# Patient Record
Sex: Female | Born: 1950 | Race: White | Hispanic: No | Marital: Single | Smoking: Former smoker
Health system: Southern US, Community
[De-identification: ages and names within clinical notes are randomized; demographics above are authoritative.]

## PROBLEM LIST (undated history)

## (undated) DIAGNOSIS — D369 Benign neoplasm, unspecified site: Secondary | ICD-10-CM

## (undated) DIAGNOSIS — F329 Major depressive disorder, single episode, unspecified: Secondary | ICD-10-CM

## (undated) DIAGNOSIS — B029 Zoster without complications: Secondary | ICD-10-CM

## (undated) DIAGNOSIS — F32A Depression, unspecified: Secondary | ICD-10-CM

## (undated) DIAGNOSIS — J449 Chronic obstructive pulmonary disease, unspecified: Secondary | ICD-10-CM

## (undated) DIAGNOSIS — E785 Hyperlipidemia, unspecified: Secondary | ICD-10-CM

## (undated) DIAGNOSIS — F419 Anxiety disorder, unspecified: Secondary | ICD-10-CM

## (undated) DIAGNOSIS — I10 Essential (primary) hypertension: Secondary | ICD-10-CM

## (undated) DIAGNOSIS — R002 Palpitations: Secondary | ICD-10-CM

## (undated) DIAGNOSIS — Z720 Tobacco use: Secondary | ICD-10-CM

## (undated) DIAGNOSIS — K635 Polyp of colon: Secondary | ICD-10-CM

## (undated) DIAGNOSIS — I2510 Atherosclerotic heart disease of native coronary artery without angina pectoris: Secondary | ICD-10-CM

## (undated) DIAGNOSIS — M1990 Unspecified osteoarthritis, unspecified site: Secondary | ICD-10-CM

## (undated) DIAGNOSIS — M810 Age-related osteoporosis without current pathological fracture: Secondary | ICD-10-CM

## (undated) DIAGNOSIS — E781 Pure hyperglyceridemia: Secondary | ICD-10-CM

## (undated) HISTORY — PX: BREAST BIOPSY: SHX20

## (undated) HISTORY — DX: Depression, unspecified: F32.A

## (undated) HISTORY — DX: Chronic obstructive pulmonary disease, unspecified: J44.9

## (undated) HISTORY — DX: Hyperlipidemia, unspecified: E78.5

## (undated) HISTORY — DX: Zoster without complications: B02.9

## (undated) HISTORY — DX: Essential (primary) hypertension: I10

## (undated) HISTORY — DX: Tobacco use: Z72.0

## (undated) HISTORY — PX: MASTECTOMY, PARTIAL: SHX709

## (undated) HISTORY — DX: Unspecified osteoarthritis, unspecified site: M19.90

## (undated) HISTORY — DX: Age-related osteoporosis without current pathological fracture: M81.0

## (undated) HISTORY — DX: Pure hyperglyceridemia: E78.1

## (undated) HISTORY — PX: VAGINAL HYSTERECTOMY: SUR661

## (undated) HISTORY — DX: Atherosclerotic heart disease of native coronary artery without angina pectoris: I25.10

## (undated) HISTORY — DX: Polyp of colon: K63.5

## (undated) HISTORY — PX: CORONARY STENT PLACEMENT: SHX1402

## (undated) HISTORY — DX: Anxiety disorder, unspecified: F41.9

## (undated) HISTORY — DX: Benign neoplasm, unspecified site: D36.9

## (undated) HISTORY — PX: TONSILLECTOMY: SUR1361

## (undated) HISTORY — DX: Major depressive disorder, single episode, unspecified: F32.9

## (undated) HISTORY — DX: Palpitations: R00.2

---

## 1998-03-01 ENCOUNTER — Other Ambulatory Visit: Admission: RE | Admit: 1998-03-01 | Discharge: 1998-03-01 | Payer: Self-pay | Admitting: *Deleted

## 1999-07-21 DIAGNOSIS — D369 Benign neoplasm, unspecified site: Secondary | ICD-10-CM | POA: Insufficient documentation

## 1999-07-21 HISTORY — DX: Benign neoplasm, unspecified site: D36.9

## 2000-07-05 ENCOUNTER — Encounter (INDEPENDENT_AMBULATORY_CARE_PROVIDER_SITE_OTHER): Payer: Self-pay

## 2000-07-05 ENCOUNTER — Ambulatory Visit (HOSPITAL_COMMUNITY): Admission: RE | Admit: 2000-07-05 | Discharge: 2000-07-05 | Payer: Self-pay | Admitting: Gastroenterology

## 2001-06-18 ENCOUNTER — Emergency Department (HOSPITAL_COMMUNITY): Admission: EM | Admit: 2001-06-18 | Discharge: 2001-06-18 | Payer: Self-pay | Admitting: Emergency Medicine

## 2002-03-03 ENCOUNTER — Encounter: Payer: Self-pay | Admitting: Cardiology

## 2002-03-03 ENCOUNTER — Ambulatory Visit (HOSPITAL_COMMUNITY): Admission: RE | Admit: 2002-03-03 | Discharge: 2002-03-04 | Payer: Self-pay | Admitting: Cardiology

## 2002-11-30 ENCOUNTER — Ambulatory Visit (HOSPITAL_COMMUNITY): Admission: RE | Admit: 2002-11-30 | Discharge: 2002-11-30 | Payer: Self-pay | Admitting: Internal Medicine

## 2003-11-20 ENCOUNTER — Observation Stay (HOSPITAL_COMMUNITY): Admission: EM | Admit: 2003-11-20 | Discharge: 2003-11-21 | Payer: Self-pay | Admitting: Emergency Medicine

## 2005-08-11 ENCOUNTER — Encounter (INDEPENDENT_AMBULATORY_CARE_PROVIDER_SITE_OTHER): Payer: Self-pay | Admitting: *Deleted

## 2005-08-11 ENCOUNTER — Ambulatory Visit (HOSPITAL_COMMUNITY): Admission: RE | Admit: 2005-08-11 | Discharge: 2005-08-11 | Payer: Self-pay | Admitting: Gastroenterology

## 2007-09-18 DIAGNOSIS — B029 Zoster without complications: Secondary | ICD-10-CM

## 2007-09-18 HISTORY — DX: Zoster without complications: B02.9

## 2009-08-14 ENCOUNTER — Encounter: Admission: RE | Admit: 2009-08-14 | Discharge: 2009-08-14 | Payer: Self-pay | Admitting: Gastroenterology

## 2010-06-02 ENCOUNTER — Encounter: Admission: RE | Admit: 2010-06-02 | Discharge: 2010-06-02 | Payer: Self-pay | Admitting: Family Medicine

## 2010-06-27 ENCOUNTER — Encounter
Admission: RE | Admit: 2010-06-27 | Discharge: 2010-06-27 | Payer: Self-pay | Source: Home / Self Care | Attending: Family Medicine | Admitting: Family Medicine

## 2010-08-20 ENCOUNTER — Encounter: Payer: Self-pay | Admitting: Neurosurgery

## 2010-12-05 NOTE — Op Note (Signed)
NAME:  Victoria Gibson, Victoria Gibson              ACCOUNT NO.:  [id number]   MEDICAL RECORD NO.:  [id number]          PATIENT TYPE:  AMB   LOCATION:  ENDO                         FACILITY:  [facility]   PHYSICIAN:  Danise Edge, M.D.   DATE OF BIRTH:  09/14/1950   DATE OF PROCEDURE:  08/11/2005  DATE OF DISCHARGE:                                 OPERATIVE REPORT   PROCEDURE:  Colonoscopy and polypectomy.   REFERRING PHYSICIAN:  Dr. Madison Hickman.   INDICATIONS:  Victoria Gibson is a 60 year old female born May 28, 1951. Victoria Gibson underwent her first screening colonoscopy approximately 5  years ago and a small tubular adenomatous polyp was removed from her rectum.  Her mother and brother have undergone colonoscopic exams to remove colon  polyps.   ENDOSCOPIST:  Danise Edge, M.D.   PREMEDICATION:  Versed 7.5 mg, Demerol 50 mg.   DESCRIPTION OF PROCEDURE:  After obtaining informed consent, Victoria Gibson was  placed in the left lateral decubitus position. I administered intravenous  Demerol and intravenous Versed to achieve conscious sedation for the  procedure. The patient's blood pressure, oxygen saturation and cardiac  rhythm were monitored throughout the procedure and documented in the medical  record.   Anal inspection and digital rectal exam were normal. The Olympus adjustable  pediatric colonoscope was introduced into the rectum and easily advanced to  the cecum. A normal-appearing ileocecal valve and appendiceal orifice were  identified. Colonic preparation for the exam today was excellent.   RECTUM:  Normal. Retroflexed view of the distal rectum normal.  SIGMOID COLON AND DESCENDING COLON:  At 30 cm from the anal verge, a 1-mm  sessile polyp was removed with the cold biopsy forceps. At 40 cm from the  anal verge, a 2-mm sessile polyp was removed with the electrocautery snare.  SPLENIC FLEXURE:  Normal.  TRANSVERSE COLON:  Normal.  HEPATIC FLEXURE:  Normal.  ASCENDING COLON:   Normal.  CECUM AND ILEOCECAL VALVE:  Normal.   ASSESSMENT:  Two small polyps were removed from the sigmoid colon; otherwise  normal surveillance proctocolonoscopy to cecum.   RECOMMENDATIONS:  Repeat colonoscopy in 5 years.           ______________________________  Danise Edge, M.D.     MJ/MEDQ  D:  08/11/2005  T:  08/11/2005  Job:  [id number]   cc:   Darius Bump, M.D.  Fax: [phone]

## 2010-12-05 NOTE — Cardiovascular Report (Signed)
NAME:  Victoria Gibson, Victoria Gibson                          ACCOUNT NO.:  [id number]   MEDICAL RECORD NO.:  [id number]                   PATIENT TYPE:  OIB   LOCATION:  6533                                 FACILITY:  [facility]   PHYSICIAN:  Cristy Hilts. Jacinto Halim, M.D.                  DATE OF BIRTH:  04-05-1951   DATE OF PROCEDURE:  DATE OF DISCHARGE:  03/04/2002                              CARDIAC CATHETERIZATION   PROCEDURE PERFORMED:  Percutaneous transluminal coronary angioplasty and  stenting of the mid right coronary artery.   INDICATION:  The patient is a 60 year old female with a history of  hypertension, hyperlipidemia, smoking presents with chest pain suggestive of  angina pectoris of class II to III.  She underwent diagnostic cardiac  catheterization at [facility] __________ and she is scheduled  for elective intervention of the right coronary artery stenosis.   INTERVENTIONAL DATA:  1. Successful primary stenting of the mid right coronary artery with a 3.5 x     13 mm Zeta stent deployed at 16 atmospheres of pressure gaining a 3.81 mm     lumen.  The stenosis was reduced from 70% to 0%. The TIMI flow was from     TIMI-3 to TIMI-3 flow. There was no evidence of dissection, no evidence     of thrombus at the end of the procedure.  2. Adjuvant Angiomax used. Intracoronary nitroglycerin administration.   RECOMMENDATIONS:  Continued aggressive risk factor modification including  smoking cessation is indicated. The patient has significantly reduced her  smoking habits.   TECHNIQUE OF CARDIAC INTERVENTION:  Under the usual sterile precautions,  using a 7 French right femoral artery access and a 7 [place] FR4 guide with  manually made side holes was advanced into the ascending aorta over the  0.035 inch J wire.  The right coronary artery was selectively engaged and  angiography was performed. Initially, there were no holes in the guide,  however, the holes were made with the help of a  percutaneous needle.  Then a  180 cm x 0.014 inch ATW guide wire was advanced into the right coronary  artery and the tip of the wire was carefully positioned in the distal right  coronary artery. Angiography was performed.  Then a 3.5 x 13 mm Zeta stent  was advanced in the right coronary artery and after confirming the position  of the stent, the stent was deployed at 16 atmospheres of pressure.  Intracoronary nitroglycerin was also administration.  There was initially  spasm noted in the outflow of the stent.  However, repeat injections  revealed excellent flow with no evidence of dissection, no evidence of  thrombus. Hence, the guide wire was removed out of the right coronary  artery.  The guide was disengaged and pulled out of the body over a 0.035  inch J wire. The arterial access was sutured in place and the patient  was  transferred to recovery in a stable condition. During the procedure,  Angiomax was used for anticoagulation and ACT was maintained at therapeutic  range. The patient tolerated the procedure well.                                               Cristy Hilts. Jacinto Halim, M.D.    Pilar Plate  D:  03/03/2002  T:  03/07/2002  Job:  [id number]   cc:   Darius Bump, M.D.

## 2010-12-05 NOTE — Discharge Summary (Signed)
NAME:  Victoria Gibson, Victoria Gibson                          ACCOUNT NO.:  [id number]   MEDICAL RECORD NO.:  [id number]                   PATIENT TYPE:  INP   LOCATION:  4731                                 FACILITY:  [facility]   PHYSICIAN:  Cristy Hilts. Jacinto Halim, M.D.                  DATE OF BIRTH:  Apr 01, 1951   DATE OF ADMISSION:  11/20/2003  DATE OF DISCHARGE:  11/21/2003                                 DISCHARGE SUMMARY   DISCHARGE DIAGNOSES:  1. Chest pain consistent with unstable angina, no restenosis and previously     stented right coronary artery site.  2. Bronchitis.  3. Smoking.  4. Treated hypertension.  5. Treated hyperlipidemia.   HOSPITAL COURSE:  The patient is a 60 year old female with a history of  coronary disease.  She had an RCA stent in August 2003.  She presented to  the emergency room, Nov 20, 2003, with complaints of chest pain.  She had  also complained of 2 weeks of increasing dyspnea on exertion.  She  complained of midsternal pressure which went down her left arm and up into  her left neck and jaw.  This was similar to her pre-intervention symptoms.  She was admitted to telemetry, started on IV heparin and nitrates and set up  for diagnostic catheterization; this was done Nov 20, 2003 by Dr. Madaline Savage.  This revealed normal left system and normal LV function and a 40%  in-stent restenosis in the previously placed RCA stent.  Plan is for  continued medical therapy.  She was seen in consult by the smoking cessation  group.  We feel she can be discharged Nov 21, 2003.  We did note that her  white count was slightly elevated at 11.9.  Her chest x-ray showed chronic  bronchitic changes.  She complains of a cough as well as shortness of  breath.  On exam, she has expiratory wheezing and rhonchi.  She is being  treated for bronchitis and will be put on a Z-Pack.  She was also given a  Ventolin treatment prior to discharge.  She will follow up with Dr. Cristy Hilts.  Jacinto Halim, Dec 14, 2003.   DISCHARGE MEDICATIONS:  1. Zithromax 250 mg once a day for 4 days.  2. Toprol-XL 50 mg a day.  3. Accupril 20 mg a day.  4. Zetia 10 mg a day.  5. Crestor 20 mg a day.  6. Plavix 75 mg a day.  7. Aspirin 81 mg a day.  8. Nitroglycerin sublingual p.r.n.   LABORATORIES:  White count 11.9, hemoglobin 13.5, hematocrit 38.4, platelets  302,000.  Sodium 141, potassium 3.7, BUN 5, creatinine 0.9, glucose 85.  CK-  MB and troponins are negative x3.  Lipid profile shows a cholesterol of 198,  triglycerides 377, HDL 33, LDL 90.   PA and lateral chest x-ray showed chronic changes with no active disease,  there was no evidence for a right lower lobe mass or infiltrate as seen on  previous portable chest x-ray.   DISPOSITION:  The patient is discharged in stable condition and will follow  up with Dr. Jacinto Halim, Dec 14, 2003 at 10:45.  She will follow up with her  family doctor, Dr. Darius Bump.      Abelino Derrick, P.A.                      Cristy Hilts. Jacinto Halim, M.D.    Lenard Lance  D:  11/21/2003  T:  11/22/2003  Job:  [id number]   cc:   Darius Bump, M.D.  [address]  [place] [address]  Fax: [phone]   Cristy Hilts. Jacinto Halim, M.D.  [address]  [place]  [place] [address]  Fax: [phone]

## 2010-12-05 NOTE — Cardiovascular Report (Signed)
NAMEALBANY, WINSLOW NO.:  [id number]   MEDICAL RECORD NO.:  [id number]                   PATIENT TYPE:  INP   LOCATION:  4731                                 FACILITY:  [facility]   PHYSICIAN:  Madaline Savage, M.D.             DATE OF BIRTH:  [birth year]   DATE OF PROCEDURE:  11/20/2003  DATE OF DISCHARGE:  11/21/2003                              CARDIAC CATHETERIZATION   PATIENT PROFILE:  The patient is a 60 year old white female patient of Dr.  Verl Dicker who previously underwent stenting to her mid right coronary artery  on or around March 03, 2002.  The patient presented to the emergency room  today with chest pain that had somewhat anginal and somewhat atypical  features with negative cardiac enzymes and no evidence of ischemia on her  EKG.  She was admitted.  She was brought to the cath lab electively.  The  patient has previously been taking aspirin and Plavix and diagnostic cardiac  catheterization was completed without significant problems.   RESULTS:   PRESSURES:  Left ventricular pressure was 138/9, end-diastolic pressure 16.  Central aortic pressure 140/82 with a mean of 110.  No aortic valve gradient  by pullback technique.   ANGIOGRAPHIC RESULTS:  1. The left main coronary artery was normal.  2. The LAD coronary artery gave rise to a diagonal branch just after the     first septal perforator branch above the LAD, itself and the diagonal     were normal.  3. The circumflex terminated on the anterolateral wall where it bifurcated     and no lesions were seen.  4. The right coronary artery was large and dominant.  There was a     radioopaque stent in the mid portion of the vessel just beyond an acute     marginal branch.  There was at most 40% in-stent restenosis in an LAO     view with excellent distal run off into what appeared to be about a 3.5     mm vessel throughout most of the course of this vessel.  The in-stent     restenosis  appeared smooth and there was TIMI-3 flow throughout with no     peculiar dye filling or emptying of the 40% area of ISR.   The left ventricular angiogram showed normal LV contractility of all wall  segments.  Ejection fraction 65%.  No mitral regurgitation seen.   FINAL IMPRESSION:  1. Single vessel coronary artery disease, status post right coronary artery     stenting August 2003.  2. 40% in-stent restenosis not felt to be hemodynamically significant.  3. Chronic tobacco use with atypical chest pain.   PLAN:  Smoking cessation will be stressed hard.  The patient will be  maintained on aspirin and Plavix and followed up by Dr. Jacinto Halim.  Cardiac  stress test is recommended periodically.  Madaline Savage, M.D.    WHG/MEDQ  D:  11/20/2003  T:  11/21/2003  Job:  [id number]

## 2010-12-05 NOTE — Op Note (Signed)
[facility]  Patient:    Victoria Gibson, Victoria Gibson                 MRN: [id number]. Date: 07/05/00 Attending:  Verlin Grills, M.D.                           Operative Report  PROCEDURES:  Colonoscopy.  INDICATION:  Ms. Victoria Gibson (date of birth Feb 10, 1951) is a 60 year old female who is referred for surveillance colonoscopy and polypectomy to prevent colon cancer.  Ms. Victoria Gibson mother has undergone routine surveillance colonoscopies and has had malignant polyps removed.  Ms. Victoria Gibson brother has undergone colonoscopic exams, and nonmalignant polyps have been removed.  Ms. Victoria Gibson viewed our colonoscopy education film in my office.  I discussed with her the complications associated with colonoscopy and polypectomy including a 1 per 1000 risk of bleeding and 4 per 1000 risk of intestinal perforation requiring surgical repair.  Ms. Victoria Gibson has signed the operative permit.  ENDOSCOPIST:  Thornton Park. Daphine Deutscher, M.D.  MEDICATION ALLERGIES:   None.  CHRONIC MEDICATIONS:  Calcium and multivitamin.  PAST MEDICAL HISTORY:  Cigarette smoking, hyperlipidemia, anxiety disorder, benign breast disease, postmenopausal, hysterectomy for uterine fibroids.  PREMEDICATION:  Versed 8.5 mg, Demerol 50 mg.  ENDOSCOPE:  Olympus pediatric colonoscope.  DESCRIPTION OF PROCEDURE:  After obtaining informed consent, the patient was placed in the left lateral decubitus position.  I administered intravenous Demerol and intravenous Versed to achieve conscious sedation for the procedure.  The patients blood pressure, oxygen saturation, and cardiac rhythm were monitored throughout the procedure and documented in the medical records.  Anal inspection was normal.  Digital rectal exam was normal.  The Olympus pediatric video colonoscope was introduced into the rectum and, under direct vision, advanced to the cecum as identified by a normal-appearing  ileocecal valve.  Colonic preparation for the exam today was excellent.  Rectum and Sigmoid colon:  From the rectum and distal sigmoid colon, three 1 mm sessile polyps were removed with hot biopsy forceps and submitted in one bottle for pathologic evaluation.  Descending Colon: Normal.  Splenic Flexure: Normal.  Transverse Colon: Normal.  Hepatic Flexure: Normal.  Ascending Colon: Normal.  Cecum and Ileocecal Valve: Normal.  ASSESSMENT:  From the rectal and distal sigmoid colon, three 1 mm sessile polyps were removed with hot biopsy forceps and submitted for pathologic interpretation.  RECOMMENDATIONS:  Repeat colonoscopy in approximately five years. DD:  07/05/00 TD:  07/05/00 Job: [id number]

## 2011-07-07 ENCOUNTER — Other Ambulatory Visit: Payer: Self-pay | Admitting: Family Medicine

## 2011-07-07 ENCOUNTER — Ambulatory Visit
Admission: RE | Admit: 2011-07-07 | Discharge: 2011-07-07 | Disposition: A | Discharge status: Home / Self Care | Payer: BC Managed Care – PPO | Source: Ambulatory Visit | Attending: Family Medicine | Admitting: Family Medicine

## 2011-07-07 DIAGNOSIS — J441 Chronic obstructive pulmonary disease with (acute) exacerbation: Secondary | ICD-10-CM

## 2011-08-06 ENCOUNTER — Encounter: Payer: Self-pay | Admitting: *Deleted

## 2011-08-06 ENCOUNTER — Institutional Professional Consult (permissible substitution): Payer: BC Managed Care – PPO | Admitting: Pulmonary Disease

## 2011-11-30 ENCOUNTER — Ambulatory Visit
Admission: RE | Admit: 2011-11-30 | Discharge: 2011-11-30 | Disposition: A | Discharge status: Home / Self Care | Payer: BC Managed Care – PPO | Source: Ambulatory Visit | Attending: Family Medicine | Admitting: Family Medicine

## 2011-11-30 ENCOUNTER — Other Ambulatory Visit: Payer: Self-pay | Admitting: Family Medicine

## 2011-11-30 DIAGNOSIS — R634 Abnormal weight loss: Secondary | ICD-10-CM

## 2011-12-07 IMAGING — CR DG LUMBAR SPINE COMPLETE 4+V
5 series · 5 of 5 positions shown · non-contrast
Comparison: None

CLINICAL DATA: Low back and left hip pain

LUMBAR SPINE - COMPLETE 4+ VIEW

[t l-spine a.p.]
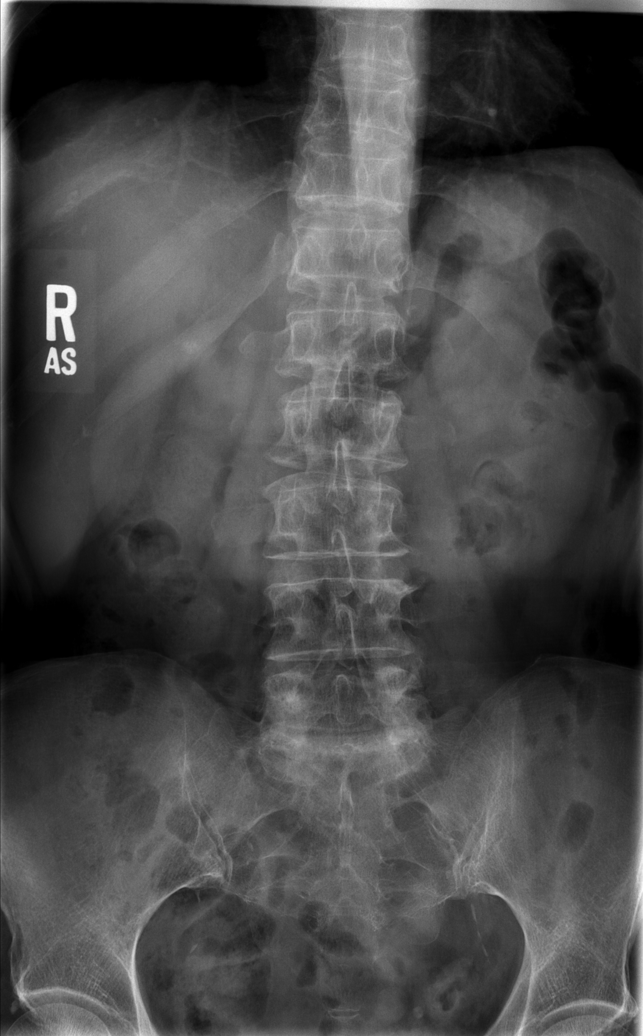

[t l-spine oblique exposure (1 of 2)]
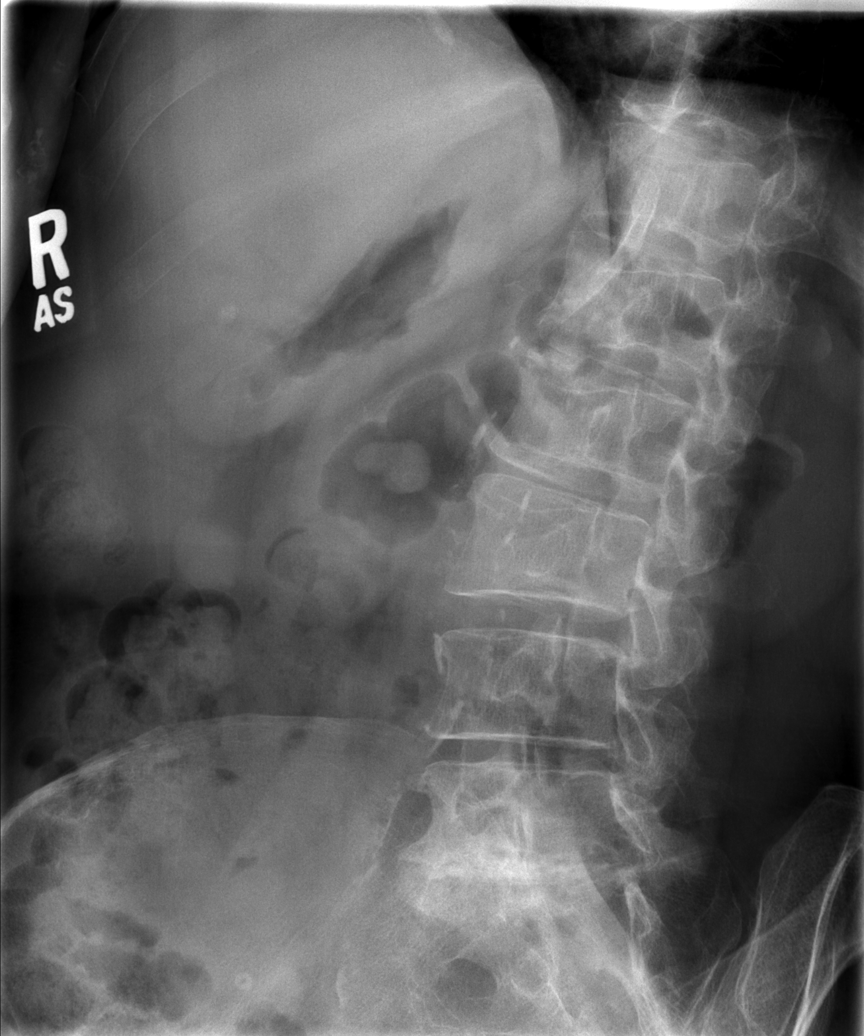

[t l-spine oblique exposure (2 of 2)]
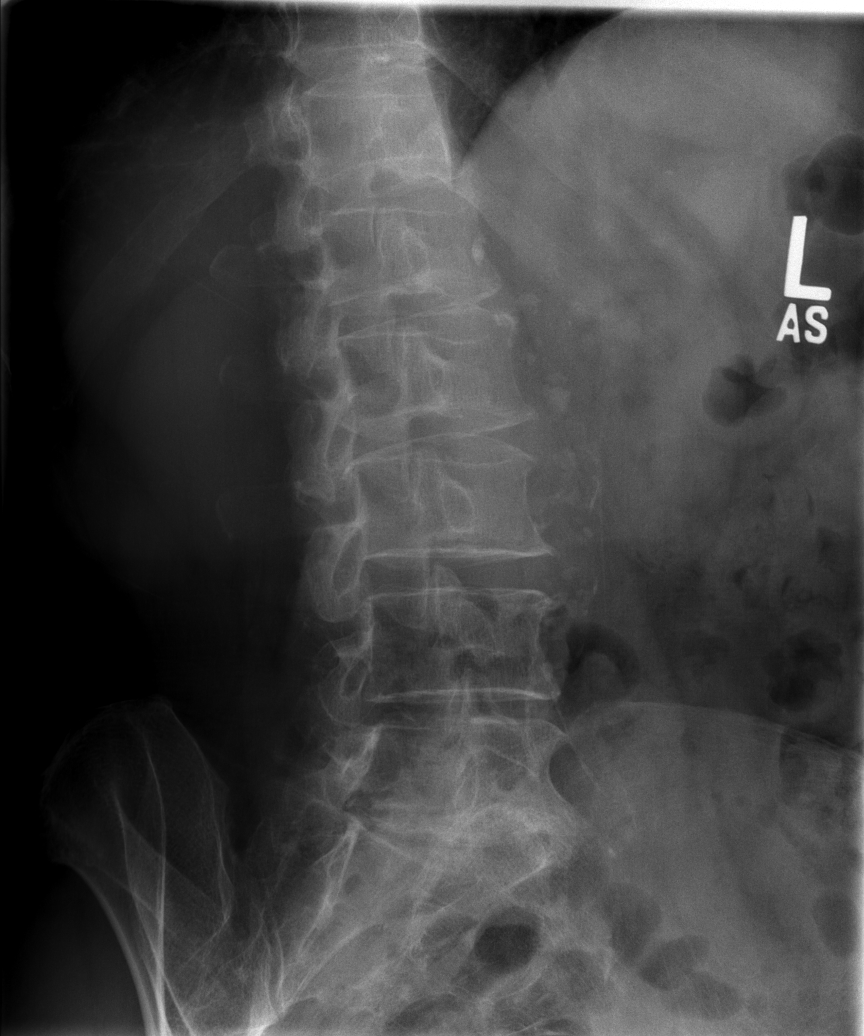

[t l-spine lat]
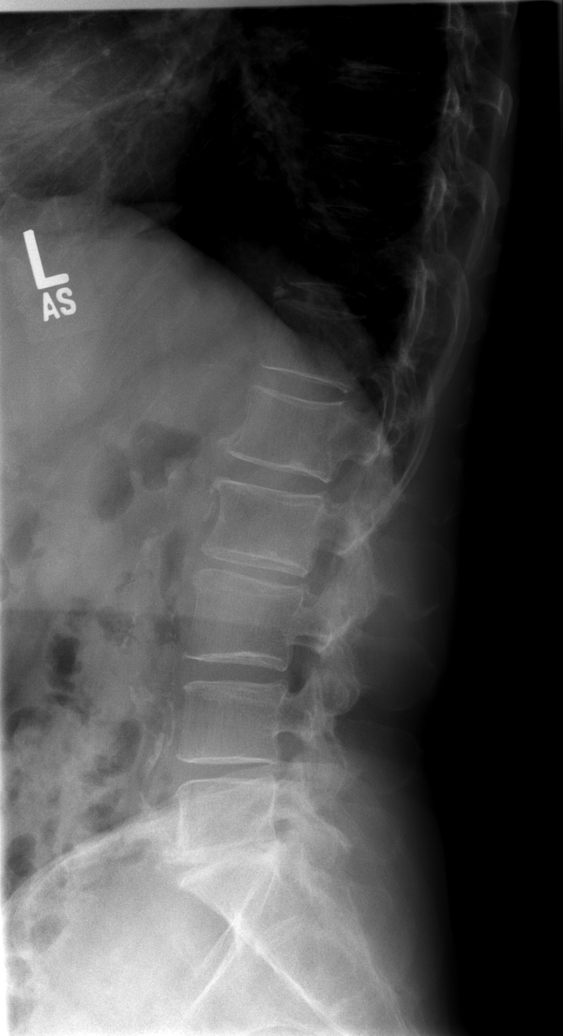

[t l-spine l5-s1 spot]
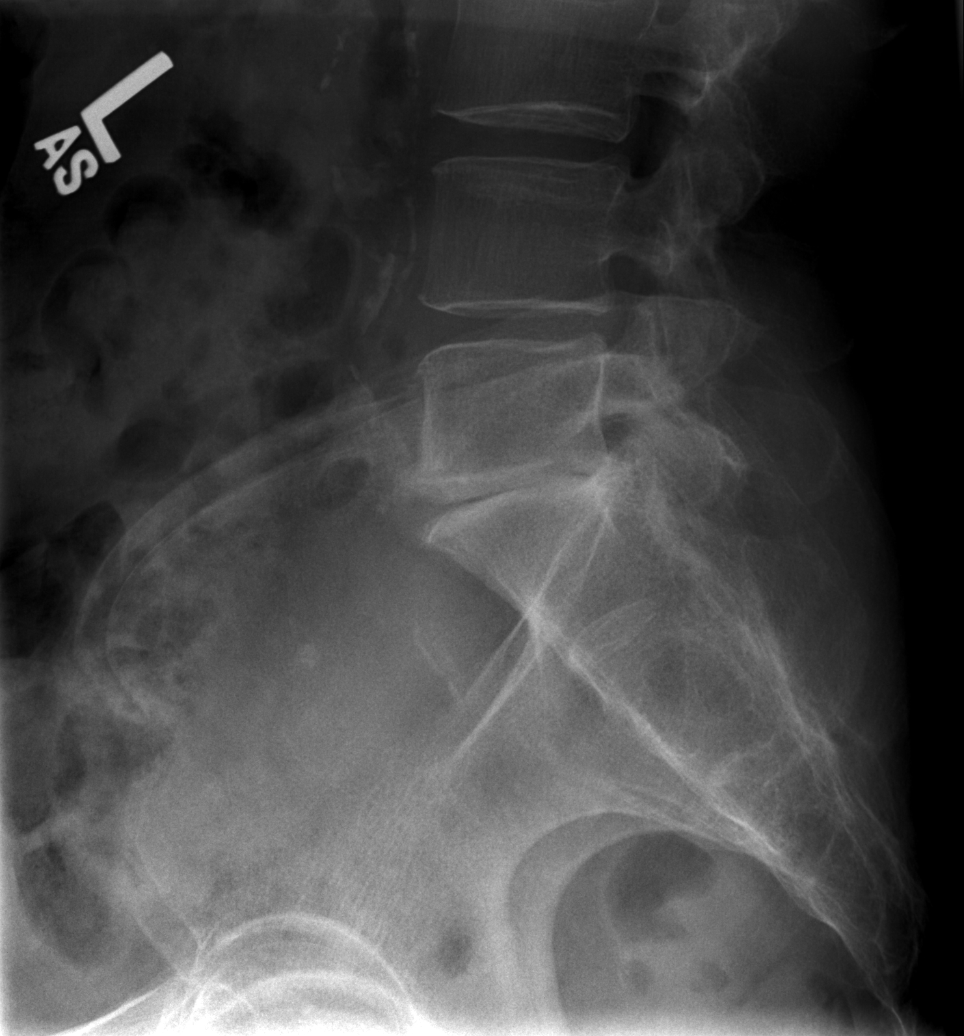

[5 of 5 positions shown; findings below may reference images not displayed]

FINDINGS: There is mild curvature convex to the right.  There is
mild disc space narrowing throughout the lumbar region with the
exception of more advanced disc space narrowing at L5-S1.  No
antero or retrolisthesis.  No significant facet degeneration
detected.  No evidence of fracture or other focal lesion.  There is
a calcification in the right upper quadrant that could be a
gallstone.  There is some aortic calcification.
IMPRESSION: Minimal curvature.  Ordinary lower lumbar degenerative disc
disease.  No identifiably acute abnormality.

## 2011-12-17 ENCOUNTER — Other Ambulatory Visit: Payer: Self-pay | Admitting: Family Medicine

## 2011-12-17 DIAGNOSIS — Z1231 Encounter for screening mammogram for malignant neoplasm of breast: Secondary | ICD-10-CM

## 2011-12-17 DIAGNOSIS — M810 Age-related osteoporosis without current pathological fracture: Secondary | ICD-10-CM

## 2012-02-23 ENCOUNTER — Ambulatory Visit
Admission: RE | Admit: 2012-02-23 | Discharge: 2012-02-23 | Disposition: A | Discharge status: Home / Self Care | Payer: BC Managed Care – PPO | Source: Ambulatory Visit | Attending: Family Medicine | Admitting: Family Medicine

## 2012-02-23 DIAGNOSIS — M810 Age-related osteoporosis without current pathological fracture: Secondary | ICD-10-CM

## 2012-02-23 DIAGNOSIS — Z1231 Encounter for screening mammogram for malignant neoplasm of breast: Secondary | ICD-10-CM

## 2013-09-01 ENCOUNTER — Other Ambulatory Visit: Payer: Self-pay | Admitting: Family Medicine

## 2013-09-01 ENCOUNTER — Ambulatory Visit
Admission: RE | Admit: 2013-09-01 | Discharge: 2013-09-01 | Disposition: A | Discharge status: Home / Self Care | Payer: No Typology Code available for payment source | Source: Ambulatory Visit | Attending: Family Medicine | Admitting: Family Medicine

## 2013-09-01 DIAGNOSIS — M810 Age-related osteoporosis without current pathological fracture: Secondary | ICD-10-CM

## 2013-09-01 DIAGNOSIS — M549 Dorsalgia, unspecified: Secondary | ICD-10-CM

## 2013-09-01 DIAGNOSIS — J449 Chronic obstructive pulmonary disease, unspecified: Secondary | ICD-10-CM

## 2014-04-18 ENCOUNTER — Other Ambulatory Visit: Payer: Self-pay | Admitting: Family

## 2014-04-18 ENCOUNTER — Ambulatory Visit
Admission: RE | Admit: 2014-04-18 | Discharge: 2014-04-18 | Disposition: A | Discharge status: Home / Self Care | Payer: No Typology Code available for payment source | Source: Ambulatory Visit | Attending: Family | Admitting: Family

## 2014-04-18 DIAGNOSIS — R109 Unspecified abdominal pain: Secondary | ICD-10-CM

## 2014-05-25 ENCOUNTER — Other Ambulatory Visit: Payer: Self-pay | Admitting: Family Medicine

## 2014-05-25 DIAGNOSIS — Z1231 Encounter for screening mammogram for malignant neoplasm of breast: Secondary | ICD-10-CM

## 2014-05-25 DIAGNOSIS — M810 Age-related osteoporosis without current pathological fracture: Secondary | ICD-10-CM

## 2014-06-12 ENCOUNTER — Other Ambulatory Visit: Payer: No Typology Code available for payment source

## 2014-06-12 ENCOUNTER — Ambulatory Visit: Payer: No Typology Code available for payment source

## 2014-06-29 ENCOUNTER — Ambulatory Visit
Admission: RE | Admit: 2014-06-29 | Discharge: 2014-06-29 | Disposition: A | Discharge status: Home / Self Care | Payer: No Typology Code available for payment source | Source: Ambulatory Visit | Attending: Family Medicine | Admitting: Family Medicine

## 2014-06-29 ENCOUNTER — Encounter (INDEPENDENT_AMBULATORY_CARE_PROVIDER_SITE_OTHER): Payer: Self-pay

## 2014-06-29 DIAGNOSIS — M810 Age-related osteoporosis without current pathological fracture: Secondary | ICD-10-CM

## 2014-06-29 DIAGNOSIS — Z1231 Encounter for screening mammogram for malignant neoplasm of breast: Secondary | ICD-10-CM

## 2014-12-20 ENCOUNTER — Ambulatory Visit
Admission: RE | Admit: 2014-12-20 | Discharge: 2014-12-20 | Disposition: A | Discharge status: Home / Self Care | Payer: 59 | Source: Ambulatory Visit | Attending: Family Medicine | Admitting: Family Medicine

## 2014-12-20 ENCOUNTER — Other Ambulatory Visit: Payer: Self-pay | Admitting: Family Medicine

## 2014-12-20 DIAGNOSIS — J441 Chronic obstructive pulmonary disease with (acute) exacerbation: Secondary | ICD-10-CM

## 2014-12-21 ENCOUNTER — Other Ambulatory Visit: Payer: Self-pay | Admitting: Internal Medicine

## 2014-12-21 ENCOUNTER — Institutional Professional Consult (permissible substitution): Payer: 59 | Admitting: Pulmonary Disease

## 2014-12-21 ENCOUNTER — Encounter: Payer: Self-pay | Admitting: Internal Medicine

## 2014-12-24 ENCOUNTER — Encounter: Payer: Self-pay | Admitting: Internal Medicine

## 2014-12-24 ENCOUNTER — Institutional Professional Consult (permissible substitution): Payer: 59 | Admitting: Internal Medicine

## 2014-12-24 ENCOUNTER — Ambulatory Visit (INDEPENDENT_AMBULATORY_CARE_PROVIDER_SITE_OTHER): Payer: 59 | Admitting: Internal Medicine

## 2014-12-24 VITALS — BP 118/72 | HR 78 | Ht 63.0 in | Wt 120.0 lb

## 2014-12-24 DIAGNOSIS — Z720 Tobacco use: Secondary | ICD-10-CM

## 2014-12-24 DIAGNOSIS — J449 Chronic obstructive pulmonary disease, unspecified: Secondary | ICD-10-CM

## 2014-12-24 DIAGNOSIS — F17210 Nicotine dependence, cigarettes, uncomplicated: Secondary | ICD-10-CM

## 2014-12-24 MED ORDER — BUDESONIDE-FORMOTEROL FUMARATE 160-4.5 MCG/ACT IN AERO: INHALATION_SPRAY | RESPIRATORY_TRACT | Status: DC

## 2014-12-24 NOTE — Progress Notes (Signed)
Subjective:    Patient ID: Victoria Gibson, female    DOB: 1950/10/05,    MRN: [id number]  HPI  39 yowf active smoker with good ex tol as child/adult until around 2011 with indolent onset persistent   am rattling congested cough and decreased ex tol x spring of 2016 referred to pulmonary clinic 12/24/2014 by Antony Blackbird for copd evaluation    12/24/2014 1st [facility] office visit/ Victoria Gibson   Chief Complaint  Patient presents with  . Advice Only    Referred by Cammie fulp for copd exacerbation.    did much better on symbicort, insutrance required her to try advair 250 worse so increased to  500 and still not back to baseline x one month prior to OV   Using multiple forms of saba with some benefit but still sob with more than slow walking at [employer] = [facility] 2 - does worse in temp extremes Congested cough was purulent > levaquin > now just white/ thick esp in am  No obvious patterns  day to day or daytime variabilty or assoc chronic cough or cp or chest tightness, subjective wheeze overt sinus or hb symptoms. No unusual exp hx or h/o childhood pna/ asthma or knowledge of premature birth.  Sleeping ok without nocturnal    exacerbation  of respiratory  c/o's or need for noct saba. Also denies any obvious fluctuation of symptoms with weather or environmental changes or other aggravating or alleviating factors except as outlined above   Current Medications, Allergies, Complete Past Medical History, Past Surgical History, Family History, and Social History were reviewed in [employer] record.           Review of Systems  Constitutional: Negative for fever and unexpected weight change.  HENT: Negative for congestion, dental problem, ear pain, nosebleeds, postnasal drip, rhinorrhea, sinus pressure, sneezing, sore throat and trouble swallowing.   Eyes: Negative for redness and itching.  Respiratory: Positive for chest tightness and shortness of breath. Negative for cough  and wheezing.   Cardiovascular: Negative for palpitations and leg swelling.  Gastrointestinal: Negative for nausea and vomiting.  Genitourinary: Negative for dysuria.  Musculoskeletal: Negative for joint swelling.  Skin: Negative for rash.  Neurological: Negative for headaches.  Hematological: Does not bruise/bleed easily.  Psychiatric/Behavioral: Negative for dysphoric mood. The patient is not nervous/anxious.        Objective:   Physical Exam    Wt Readings from Last 3 Encounters:  12/24/14 120 lb (54.432 kg)    Vital signs reviewed   amb wf rattling/ congested cough  HEENT: upper dentures/ lower partials, nl  turbinates, and orophanx. Nl external ear canals without cough reflex   NECK :  without JVD/Nodes/TM/ nl carotid upstrokes bilaterally   LUNGS: no acc muscle use, bs distant with faint late bilateral exp wheeze / pseudowheeze better with plm   CV:  RRR  no s3 or murmur or increase in P2, no edema   ABD:  soft and nontender with nl excursion in the supine position. No bruits or organomegaly, bowel sounds nl  MS:  warm without deformities, calf tenderness, cyanosis or clubbing  SKIN: warm and dry without lesions    NEURO:  alert, approp, no deficits      I personally reviewed images and agree with radiology impression as follows:  CXR:  12/20/14  COPD. There is no pneumonia, CHF, nor other acute cardiopulmonary abnormality.    No labs on file on review    Assessment &  Plan:

## 2014-12-24 NOTE — Patient Instructions (Addendum)
Plan A = automatic = symbicort 160 Take 2 puffs first thing in am and then another 2 puffs about 12 hours later.                                     atrovent 2 puffs four times daily   Plan B = - Only use your albuterol (proventil)  as a rescue medication to be used if you can't catch your breath by resting or doing a relaxed purse lip breathing pattern.  - The less you use it, the better it will work when you need it. - Ok to use up to 2 puffs  every 4 hours if you must but call for immediate appointment if use goes up over your usual need - Don't leave home without it !!  (think of it like the spare tire for your car)   Plan C = crisis - only use your albuterol  Nebulizer only use if the proventil up to every 4 hours  Plan D = call me if not getting relief  Plan E = ER   Please schedule a follow up office visit in 6 weeks, call sooner if needed with PFTs

## 2014-12-25 ENCOUNTER — Encounter: Payer: Self-pay | Admitting: Internal Medicine

## 2014-12-25 DIAGNOSIS — J449 Chronic obstructive pulmonary disease, unspecified: Secondary | ICD-10-CM

## 2014-12-25 DIAGNOSIS — F17210 Nicotine dependence, cigarettes, uncomplicated: Secondary | ICD-10-CM | POA: Insufficient documentation

## 2014-12-25 HISTORY — DX: Nicotine dependence, cigarettes, uncomplicated: F17.210

## 2014-12-25 HISTORY — DX: Chronic obstructive pulmonary disease, unspecified: J44.9

## 2014-12-25 NOTE — Assessment & Plan Note (Signed)
>   3 min  I took an extended  opportunity with this patient to outline the consequences of continued cigarette use  in airway disorders based on all the data we have from the multiple national lung health studies (perfomed over decades at millions of dollars in cost)  indicating that smoking cessation, not choice of inhalers or physicians, is the most important aspect of care.    Her husband quit last year, she promises to start the process soon, defer specific rx to primary care

## 2014-12-25 NOTE — Assessment & Plan Note (Signed)
Appears to have mild/moderate copd characterized by more bronchitis/ asthmatic pattern than emphysema but either way the symptoms are very bothersome and difficult to control  DDX of  difficult airways management all start with A and  include Adherence, Ace Inhibitors, Acid Reflux, Active Sinus Disease, Alpha 1 Antitripsin deficiency, Anxiety masquerading as Airways dz,  ABPA,  allergy(esp in young), Aspiration (esp in elderly), Adverse effects of DPI,  Active smokers, plus two Bs  = Bronchiectasis and Beta blocker use..and one C= CHF   Adherence is always the initial "prime suspect" and is a multilayered concern that requires a "trust but verify" approach in every patient - starting with knowing how to use medications, especially inhalers, correctly, keeping up with refills and understanding the fundamental difference between maintenance and prns vs those medications only taken for a very short course and then stopped and not refilled.  - confused with meds maint vs prns - The proper method of use, as well as anticipated side effects, of a metered-dose inhaler are discussed and demonstrated to the patient. Improved effectiveness after extensive coaching during this visit to a level of approximately  75% from a basleline of < 25   Active smoking obvious concern > see sep a/p  ? Acid (or non-acid) GERD > always difficult to exclude as up to 75% of pts in some series report no assoc GI/ Heartburn symptoms> consider next  max (24h)  acid suppression and diet restrictions/ reviewed and instructions given in writing.   Each maintenance medication was reviewed in detail including most importantly the difference between maintenance and as needed and under what circumstances the prns are to be used.  Please see instructions for details which were reviewed in writing and the patient given a copy.

## 2015-01-31 ENCOUNTER — Other Ambulatory Visit: Payer: Self-pay | Admitting: *Deleted

## 2015-01-31 DIAGNOSIS — R0600 Dyspnea, unspecified: Secondary | ICD-10-CM

## 2015-02-04 ENCOUNTER — Encounter: Payer: Self-pay | Admitting: Internal Medicine

## 2015-02-04 ENCOUNTER — Ambulatory Visit (INDEPENDENT_AMBULATORY_CARE_PROVIDER_SITE_OTHER): Payer: 59 | Admitting: Internal Medicine

## 2015-02-04 VITALS — BP 102/62 | HR 68 | Ht 64.0 in | Wt 123.0 lb

## 2015-02-04 DIAGNOSIS — J449 Chronic obstructive pulmonary disease, unspecified: Secondary | ICD-10-CM

## 2015-02-04 DIAGNOSIS — R0600 Dyspnea, unspecified: Secondary | ICD-10-CM

## 2015-02-04 DIAGNOSIS — F17210 Nicotine dependence, cigarettes, uncomplicated: Secondary | ICD-10-CM

## 2015-02-04 DIAGNOSIS — Z720 Tobacco use: Secondary | ICD-10-CM | POA: Diagnosis not present

## 2015-02-04 LAB — PULMONARY FUNCTION TEST
DL/VA % pred: 50 %
DL/VA: 2.43 ml/min/mmHg/L
DLCO UNC % PRED: 38 %
DLCO unc: 9.35 ml/min/mmHg
FEF 25-75 POST: 0.42 L/s
FEF 25-75 Pre: 0.41 L/sec
FEF2575-%CHANGE-POST: 3 %
FEF2575-%Pred-Post: 19 %
FEF2575-%Pred-Pre: 19 %
FEV1-%Change-Post: -1 %
FEV1-%Pred-Post: 39 %
FEV1-%Pred-Pre: 40 %
FEV1-POST: 0.97 L
FEV1-PRE: 0.98 L
FEV1FVC-%CHANGE-POST: -1 %
FEV1FVC-%PRED-PRE: 64 %
FEV6-%Change-Post: 0 %
FEV6-%PRED-POST: 62 %
FEV6-%Pred-Pre: 62 %
FEV6-PRE: 1.89 L
FEV6-Post: 1.91 L
FEV6FVC-%CHANGE-POST: 0 %
FEV6FVC-%PRED-POST: 101 %
FEV6FVC-%PRED-PRE: 100 %
FVC-%Change-Post: 0 %
FVC-%PRED-POST: 61 %
FVC-%Pred-Pre: 61 %
FVC-Post: 1.96 L
FVC-Pre: 1.96 L
POST FEV6/FVC RATIO: 97 %
PRE FEV1/FVC RATIO: 50 %
Post FEV1/FVC ratio: 49 %
Pre FEV6/FVC Ratio: 96 %
RV % PRED: 124 %
RV: 2.59 L
TLC % pred: 93 %
TLC: 4.72 L

## 2015-02-04 MED ORDER — ACLIDINIUM BROMIDE 400 MCG/ACT IN AEPB: 1.0000 | INHALATION_SPRAY | Freq: Two times a day (BID) | RESPIRATORY_TRACT | Status: DC

## 2015-02-04 NOTE — Progress Notes (Signed)
PFT done today. 

## 2015-02-04 NOTE — Patient Instructions (Addendum)
The key is to stop smoking completely before smoking completely stops you!   Add tudorza one twice daily after symbicort   Please schedule a follow up visit in 3 months but call sooner if needed

## 2015-02-04 NOTE — Progress Notes (Signed)
Subjective:    Patient ID: Victoria Gibson, female    DOB: 05-07-1951,    MRN: [id number]   Brief patient profile:  34 yowf active smoker with good ex tol as child/adult until around 2011 with indolent onset persistent   am rattling congested cough and decreased ex tol x spring of 2016 referred to pulmonary clinic 12/24/2014 by Cammie Fulp for copd evaluation and proved to have GOLD IV criteria on f/u 02/04/15    History of Present Illness  12/24/2014 1st [facility] office visit/ Bethan Adamek   Chief Complaint  Patient presents with  . Advice Only    Referred by Cammie fulp for copd exacerbation.    did much better on symbicort, insutrance required her to try advair 250 worse so increased to  500 and still not back to baseline x one month prior to OV   Using multiple forms of saba with some benefit but still sob with more than slow walking at [employer] = [facility] 2 - does worse in temp extremes Congested cough was purulent > levaquin > now just white/ thick esp in am rec Plan A = automatic = symbicort 160 Take 2 puffs first thing in am and then another 2 puffs about 12 hours later.                                     atrovent 2 puffs four times daily  Plan B = - Only use your albuterol (proventil)  as a rescue medication Plan C = crisis - only use your albuterol  Nebulizer only use if the proventil up to every 4 hours  Plan D = call me if not getting relief Plan E = ER   02/04/2015 f/u ov/Samanthan Dugo re: GOLD IV copd on symbicort and atovent qid Chief Complaint  Patient presents with  . Follow-up    pft today. no complaints    Much better even mowing her own grass albeit early in ams x 20 min push mower level Min thick white mucus is am Rarely needs saba never in neb form    No obvious day to day or daytime variability or assoc   cp or chest tightness, subjective wheeze or overt sinus or hb symptoms. No unusual exp hx or h/o childhood pna/ asthma or knowledge of premature birth.  Sleeping ok  without nocturnal  or early am exacerbation  of respiratory  c/o's or need for noct saba. Also denies any obvious fluctuation of symptoms with weather or environmental changes or other aggravating or alleviating factors except as outlined above   Current Medications, Allergies, Complete Past Medical History, Past Surgical History, Family History, and Social History were reviewed in [employer] record.  ROS  The following are not active complaints unless bolded sore throat, dysphagia, dental problems, itching, sneezing,  nasal congestion or excess/ purulent secretions, ear ache,   fever, chills, sweats, unintended wt loss, classically pleuritic or exertional cp, hemoptysis,  orthopnea pnd or leg swelling, presyncope, palpitations, abdominal pain, anorexia, nausea, vomiting, diarrhea  or change in bowel or bladder habits, change in stools or urine, dysuria,hematuria,  rash, arthralgias, visual complaints, headache, numbness, weakness or ataxia or problems with walking or coordination,  change in mood/affect or memory.                        Objective:   Physical Exam  Wt Readings from Last 3 Encounters:  02/04/15 123 lb (55.792 kg)  12/24/14 120 lb (54.432 kg)    Vital signs reviewed   amb wf  Still some rattling/ congested cough  HEENT: upper dentures/ lower partials, nl  turbinates, and orophanx. Nl external ear canals without cough reflex   NECK :  without JVD/Nodes/TM/ nl carotid upstrokes bilaterally   LUNGS: no acc muscle use, bs distant with faint late bilateral exp wheeze / pseudowheeze better with plm   CV:  RRR  no s3 or murmur or increase in P2, no edema   ABD:  soft and nontender with nl excursion in the supine position. No bruits or organomegaly, bowel sounds nl  MS:  warm without deformities, calf tenderness, cyanosis or clubbing  SKIN: warm and dry without lesions    NEURO:  alert, approp, no deficits      I personally reviewed  images and agree with radiology impression as follows:  CXR:  12/20/14  COPD. There is no pneumonia, CHF, nor other acute cardiopulmonary abnormality.        Assessment & Plan:   Outpatient Encounter Prescriptions as of 02/04/2015  Medication Sig  . albuterol (PROVENTIL HFA;VENTOLIN HFA) 108 (90 BASE) MCG/ACT inhaler Inhale 1-2 puffs into the lungs every 6 (six) hours as needed for wheezing or shortness of breath.  Marland Kitchen albuterol (PROVENTIL) (2.5 MG/3ML) 0.083% nebulizer solution Take 2.5 mg by nebulization every 6 (six) hours as needed for wheezing or shortness of breath.  Marland Kitchen aspirin 81 MG tablet Take 81 mg by mouth daily.  Marland Kitchen atorvastatin (LIPITOR) 40 MG tablet Take 40 mg by mouth daily.  . budesonide-formoterol (SYMBICORT) 160-4.5 MCG/ACT inhaler Take 2 puffs first thing in am and then another 2 puffs about 12 hours later.  . fenofibrate 160 MG tablet Take 160 mg by mouth daily.  Marland Kitchen ipratropium (ATROVENT HFA) 17 MCG/ACT inhaler Inhale 2 puffs into the lungs every 4 (four) hours as needed for wheezing.  . metoprolol succinate (TOPROL-XL) 50 MG 24 hr tablet Take 50 mg by mouth daily. Take with or immediately following a meal.  . Aclidinium Bromide (TUDORZA PRESSAIR) 400 MCG/ACT AEPB Inhale 1 puff into the lungs 2 (two) times daily. One twice daily  . [DISCONTINUED] levofloxacin (LEVAQUIN) 500 MG tablet Take 500 mg by mouth daily.  . [DISCONTINUED] predniSONE (DELTASONE) 20 MG tablet Take 20 mg by mouth daily with breakfast.   No facility-administered encounter medications on file as of 02/04/2015.

## 2015-02-05 ENCOUNTER — Encounter: Payer: Self-pay | Admitting: Internal Medicine

## 2015-02-05 NOTE — Assessment & Plan Note (Signed)
>   3 min discussion  I emphasized that although we never turn away smokers from the pulmonary clinic, we do ask that they understand that the recommendations that we make  won't work nearly as well in the presence of continued cigarette exposure.  In fact, we may very well  reach a point where we can't promise to help the patient if  she can't quit smoking. (We can and will promise to try to help, we just can't promise what we recommend will really work)  

## 2015-02-05 NOTE — Assessment & Plan Note (Addendum)
-   12/24/2014 p extensive coaching HFA effectiveness =    75% > symbicort 160 2bid/ atroven qid maint rx  - PFTs  02/04/2015    FEV1  0.97 (39%) ratio 49 no better p saba after am symbicort  and dlco 36% corrects to 50 for alv vol  She's doing exceptionally well on present rx considering severity of airflow obst but using atovent qid and would be better served with a LAMA given how severely she's obst  The proper method of use, as well as anticipated side effects, of a metered-dose inhaler are discussed and demonstrated to the patient. Improved effectiveness after extensive coaching during this visit to a level of approximately  90% today and 90% with dpi so turdorza one bid added x 2 week sample   I had an extended discussion with the patient reviewing all relevant studies completed to date and  lasting 15 to 20 minutes of a 25 minute visit    Each maintenance medication was reviewed in detail including most importantly the difference between maintenance and prns and under what circumstances the prns are to be triggered using an action plan format that is not reflected in the computer generated alphabetically organized AVS.    Please see instructions for details which were reviewed in writing and the patient given a copy highlighting the part that I personally wrote and discussed at today's ov.

## 2015-10-11 DIAGNOSIS — I209 Angina pectoris, unspecified: Secondary | ICD-10-CM | POA: Diagnosis not present

## 2015-10-11 DIAGNOSIS — I700 Atherosclerosis of aorta: Secondary | ICD-10-CM | POA: Diagnosis not present

## 2015-10-11 DIAGNOSIS — Z0000 Encounter for general adult medical examination without abnormal findings: Secondary | ICD-10-CM | POA: Diagnosis not present

## 2015-10-11 DIAGNOSIS — Z6820 Body mass index (BMI) 20.0-20.9, adult: Secondary | ICD-10-CM | POA: Diagnosis not present

## 2015-10-11 DIAGNOSIS — J449 Chronic obstructive pulmonary disease, unspecified: Secondary | ICD-10-CM | POA: Diagnosis not present

## 2015-11-08 DIAGNOSIS — J441 Chronic obstructive pulmonary disease with (acute) exacerbation: Secondary | ICD-10-CM | POA: Diagnosis not present

## 2015-12-06 ENCOUNTER — Other Ambulatory Visit: Payer: Self-pay | Admitting: Family Medicine

## 2015-12-06 ENCOUNTER — Ambulatory Visit
Admission: RE | Admit: 2015-12-06 | Discharge: 2015-12-06 | Disposition: A | Discharge status: Home / Self Care | Payer: PPO | Source: Ambulatory Visit | Attending: Family Medicine | Admitting: Family Medicine

## 2015-12-06 DIAGNOSIS — M810 Age-related osteoporosis without current pathological fracture: Secondary | ICD-10-CM | POA: Diagnosis not present

## 2015-12-06 DIAGNOSIS — M546 Pain in thoracic spine: Secondary | ICD-10-CM | POA: Diagnosis not present

## 2015-12-06 DIAGNOSIS — M549 Dorsalgia, unspecified: Secondary | ICD-10-CM | POA: Diagnosis not present

## 2015-12-06 DIAGNOSIS — M5489 Other dorsalgia: Secondary | ICD-10-CM

## 2015-12-06 DIAGNOSIS — M542 Cervicalgia: Secondary | ICD-10-CM | POA: Diagnosis not present

## 2015-12-30 DIAGNOSIS — M4850XA Collapsed vertebra, not elsewhere classified, site unspecified, initial encounter for fracture: Secondary | ICD-10-CM | POA: Diagnosis not present

## 2015-12-30 DIAGNOSIS — M549 Dorsalgia, unspecified: Secondary | ICD-10-CM | POA: Diagnosis not present

## 2016-02-13 DIAGNOSIS — S32001A Stable burst fracture of unspecified lumbar vertebra, initial encounter for closed fracture: Secondary | ICD-10-CM | POA: Diagnosis not present

## 2016-02-28 DIAGNOSIS — S32001A Stable burst fracture of unspecified lumbar vertebra, initial encounter for closed fracture: Secondary | ICD-10-CM | POA: Diagnosis not present

## 2016-02-28 DIAGNOSIS — M546 Pain in thoracic spine: Secondary | ICD-10-CM | POA: Diagnosis not present

## 2016-03-12 DIAGNOSIS — S32001A Stable burst fracture of unspecified lumbar vertebra, initial encounter for closed fracture: Secondary | ICD-10-CM | POA: Diagnosis not present

## 2016-04-24 DIAGNOSIS — I10 Essential (primary) hypertension: Secondary | ICD-10-CM | POA: Diagnosis not present

## 2016-04-24 DIAGNOSIS — F17200 Nicotine dependence, unspecified, uncomplicated: Secondary | ICD-10-CM | POA: Diagnosis not present

## 2016-04-24 DIAGNOSIS — M4850XA Collapsed vertebra, not elsewhere classified, site unspecified, initial encounter for fracture: Secondary | ICD-10-CM | POA: Diagnosis not present

## 2016-04-24 DIAGNOSIS — Z79899 Other long term (current) drug therapy: Secondary | ICD-10-CM | POA: Diagnosis not present

## 2016-04-24 DIAGNOSIS — G8929 Other chronic pain: Secondary | ICD-10-CM | POA: Diagnosis not present

## 2016-04-24 DIAGNOSIS — M5417 Radiculopathy, lumbosacral region: Secondary | ICD-10-CM | POA: Diagnosis not present

## 2016-04-24 DIAGNOSIS — Z1239 Encounter for other screening for malignant neoplasm of breast: Secondary | ICD-10-CM | POA: Diagnosis not present

## 2016-04-24 DIAGNOSIS — I2510 Atherosclerotic heart disease of native coronary artery without angina pectoris: Secondary | ICD-10-CM | POA: Diagnosis not present

## 2016-04-24 DIAGNOSIS — Z7982 Long term (current) use of aspirin: Secondary | ICD-10-CM | POA: Diagnosis not present

## 2016-04-24 DIAGNOSIS — E785 Hyperlipidemia, unspecified: Secondary | ICD-10-CM | POA: Diagnosis not present

## 2016-04-27 ENCOUNTER — Other Ambulatory Visit: Payer: Self-pay | Admitting: Family Medicine

## 2016-04-27 DIAGNOSIS — Z1231 Encounter for screening mammogram for malignant neoplasm of breast: Secondary | ICD-10-CM

## 2016-04-27 DIAGNOSIS — E2839 Other primary ovarian failure: Secondary | ICD-10-CM

## 2016-05-12 ENCOUNTER — Other Ambulatory Visit: Payer: PPO

## 2016-05-12 ENCOUNTER — Ambulatory Visit: Payer: PPO

## 2016-08-10 DIAGNOSIS — J449 Chronic obstructive pulmonary disease, unspecified: Secondary | ICD-10-CM | POA: Diagnosis not present

## 2016-08-10 DIAGNOSIS — M4850XA Collapsed vertebra, not elsewhere classified, site unspecified, initial encounter for fracture: Secondary | ICD-10-CM | POA: Diagnosis not present

## 2016-08-10 DIAGNOSIS — D72829 Elevated white blood cell count, unspecified: Secondary | ICD-10-CM | POA: Diagnosis not present

## 2016-08-10 DIAGNOSIS — G8929 Other chronic pain: Secondary | ICD-10-CM | POA: Diagnosis not present

## 2016-08-10 DIAGNOSIS — E559 Vitamin D deficiency, unspecified: Secondary | ICD-10-CM | POA: Diagnosis not present

## 2016-08-10 DIAGNOSIS — M8000XD Age-related osteoporosis with current pathological fracture, unspecified site, subsequent encounter for fracture with routine healing: Secondary | ICD-10-CM | POA: Diagnosis not present

## 2016-09-01 DIAGNOSIS — D72829 Elevated white blood cell count, unspecified: Secondary | ICD-10-CM | POA: Diagnosis not present

## 2016-09-01 DIAGNOSIS — R634 Abnormal weight loss: Secondary | ICD-10-CM | POA: Diagnosis not present

## 2016-09-01 DIAGNOSIS — F17210 Nicotine dependence, cigarettes, uncomplicated: Secondary | ICD-10-CM | POA: Diagnosis not present

## 2016-09-01 DIAGNOSIS — Z716 Tobacco abuse counseling: Secondary | ICD-10-CM | POA: Diagnosis not present

## 2016-09-01 DIAGNOSIS — Z8042 Family history of malignant neoplasm of prostate: Secondary | ICD-10-CM | POA: Diagnosis not present

## 2016-09-01 DIAGNOSIS — Z800 Family history of malignant neoplasm of digestive organs: Secondary | ICD-10-CM | POA: Diagnosis not present

## 2016-09-14 DIAGNOSIS — R634 Abnormal weight loss: Secondary | ICD-10-CM | POA: Diagnosis not present

## 2016-09-14 DIAGNOSIS — D72829 Elevated white blood cell count, unspecified: Secondary | ICD-10-CM | POA: Diagnosis not present

## 2016-09-14 DIAGNOSIS — F17210 Nicotine dependence, cigarettes, uncomplicated: Secondary | ICD-10-CM | POA: Diagnosis not present

## 2016-09-14 DIAGNOSIS — R918 Other nonspecific abnormal finding of lung field: Secondary | ICD-10-CM | POA: Diagnosis not present

## 2016-09-14 DIAGNOSIS — J449 Chronic obstructive pulmonary disease, unspecified: Secondary | ICD-10-CM | POA: Diagnosis not present

## 2016-09-15 DIAGNOSIS — Z8371 Family history of colonic polyps: Secondary | ICD-10-CM | POA: Diagnosis not present

## 2016-09-15 DIAGNOSIS — D72829 Elevated white blood cell count, unspecified: Secondary | ICD-10-CM | POA: Diagnosis not present

## 2016-09-15 DIAGNOSIS — Z716 Tobacco abuse counseling: Secondary | ICD-10-CM | POA: Diagnosis not present

## 2016-09-15 DIAGNOSIS — Z8042 Family history of malignant neoplasm of prostate: Secondary | ICD-10-CM | POA: Diagnosis not present

## 2016-09-15 DIAGNOSIS — F17210 Nicotine dependence, cigarettes, uncomplicated: Secondary | ICD-10-CM | POA: Diagnosis not present

## 2016-09-15 DIAGNOSIS — E039 Hypothyroidism, unspecified: Secondary | ICD-10-CM | POA: Diagnosis not present

## 2016-11-09 DIAGNOSIS — I10 Essential (primary) hypertension: Secondary | ICD-10-CM | POA: Diagnosis not present

## 2016-11-09 DIAGNOSIS — I2510 Atherosclerotic heart disease of native coronary artery without angina pectoris: Secondary | ICD-10-CM | POA: Diagnosis not present

## 2016-11-09 DIAGNOSIS — N200 Calculus of kidney: Secondary | ICD-10-CM | POA: Diagnosis not present

## 2016-11-09 DIAGNOSIS — G894 Chronic pain syndrome: Secondary | ICD-10-CM | POA: Diagnosis not present

## 2016-11-09 DIAGNOSIS — J449 Chronic obstructive pulmonary disease, unspecified: Secondary | ICD-10-CM | POA: Diagnosis not present

## 2016-11-09 DIAGNOSIS — M8000XD Age-related osteoporosis with current pathological fracture, unspecified site, subsequent encounter for fracture with routine healing: Secondary | ICD-10-CM | POA: Diagnosis not present

## 2016-12-28 DIAGNOSIS — M810 Age-related osteoporosis without current pathological fracture: Secondary | ICD-10-CM | POA: Diagnosis not present

## 2016-12-28 DIAGNOSIS — E2839 Other primary ovarian failure: Secondary | ICD-10-CM | POA: Diagnosis not present

## 2017-02-01 DIAGNOSIS — G894 Chronic pain syndrome: Secondary | ICD-10-CM | POA: Diagnosis not present

## 2017-02-01 DIAGNOSIS — E785 Hyperlipidemia, unspecified: Secondary | ICD-10-CM | POA: Diagnosis not present

## 2017-02-01 DIAGNOSIS — Z1231 Encounter for screening mammogram for malignant neoplasm of breast: Secondary | ICD-10-CM | POA: Diagnosis not present

## 2017-02-01 DIAGNOSIS — R000 Tachycardia, unspecified: Secondary | ICD-10-CM | POA: Diagnosis not present

## 2017-02-01 DIAGNOSIS — J449 Chronic obstructive pulmonary disease, unspecified: Secondary | ICD-10-CM | POA: Diagnosis not present

## 2017-02-01 DIAGNOSIS — M810 Age-related osteoporosis without current pathological fracture: Secondary | ICD-10-CM | POA: Diagnosis not present

## 2017-02-01 DIAGNOSIS — Z79891 Long term (current) use of opiate analgesic: Secondary | ICD-10-CM | POA: Diagnosis not present

## 2017-02-01 DIAGNOSIS — Z139 Encounter for screening, unspecified: Secondary | ICD-10-CM | POA: Diagnosis not present

## 2017-02-19 DIAGNOSIS — Z1231 Encounter for screening mammogram for malignant neoplasm of breast: Secondary | ICD-10-CM | POA: Diagnosis not present

## 2017-02-26 DIAGNOSIS — K1120 Sialoadenitis, unspecified: Secondary | ICD-10-CM | POA: Diagnosis not present

## 2017-02-26 DIAGNOSIS — Z681 Body mass index (BMI) 19 or less, adult: Secondary | ICD-10-CM | POA: Diagnosis not present

## 2017-03-08 DIAGNOSIS — Z681 Body mass index (BMI) 19 or less, adult: Secondary | ICD-10-CM | POA: Diagnosis not present

## 2017-03-08 DIAGNOSIS — D72829 Elevated white blood cell count, unspecified: Secondary | ICD-10-CM | POA: Diagnosis not present

## 2017-03-08 DIAGNOSIS — E785 Hyperlipidemia, unspecified: Secondary | ICD-10-CM | POA: Diagnosis not present

## 2017-03-08 DIAGNOSIS — Z79891 Long term (current) use of opiate analgesic: Secondary | ICD-10-CM | POA: Diagnosis not present

## 2017-03-08 DIAGNOSIS — K1120 Sialoadenitis, unspecified: Secondary | ICD-10-CM | POA: Diagnosis not present

## 2017-03-08 DIAGNOSIS — G894 Chronic pain syndrome: Secondary | ICD-10-CM | POA: Diagnosis not present

## 2017-03-08 DIAGNOSIS — R7309 Other abnormal glucose: Secondary | ICD-10-CM | POA: Diagnosis not present

## 2017-04-05 DIAGNOSIS — G894 Chronic pain syndrome: Secondary | ICD-10-CM | POA: Diagnosis not present

## 2017-04-05 DIAGNOSIS — Z139 Encounter for screening, unspecified: Secondary | ICD-10-CM | POA: Diagnosis not present

## 2017-04-05 DIAGNOSIS — I959 Hypotension, unspecified: Secondary | ICD-10-CM | POA: Diagnosis not present

## 2017-05-10 DIAGNOSIS — Z23 Encounter for immunization: Secondary | ICD-10-CM | POA: Diagnosis not present

## 2017-05-10 DIAGNOSIS — Z139 Encounter for screening, unspecified: Secondary | ICD-10-CM | POA: Diagnosis not present

## 2017-05-10 DIAGNOSIS — G894 Chronic pain syndrome: Secondary | ICD-10-CM | POA: Diagnosis not present

## 2017-06-03 DIAGNOSIS — M546 Pain in thoracic spine: Secondary | ICD-10-CM | POA: Diagnosis not present

## 2017-06-03 DIAGNOSIS — M810 Age-related osteoporosis without current pathological fracture: Secondary | ICD-10-CM | POA: Diagnosis not present

## 2017-06-08 DIAGNOSIS — E785 Hyperlipidemia, unspecified: Secondary | ICD-10-CM | POA: Diagnosis not present

## 2017-06-08 DIAGNOSIS — Z139 Encounter for screening, unspecified: Secondary | ICD-10-CM | POA: Diagnosis not present

## 2017-06-08 DIAGNOSIS — Z9181 History of falling: Secondary | ICD-10-CM | POA: Diagnosis not present

## 2017-06-08 DIAGNOSIS — Z1331 Encounter for screening for depression: Secondary | ICD-10-CM | POA: Diagnosis not present

## 2017-06-08 DIAGNOSIS — Z136 Encounter for screening for cardiovascular disorders: Secondary | ICD-10-CM | POA: Diagnosis not present

## 2017-06-08 DIAGNOSIS — Z0000 Encounter for general adult medical examination without abnormal findings: Secondary | ICD-10-CM | POA: Diagnosis not present

## 2017-06-08 DIAGNOSIS — Z1211 Encounter for screening for malignant neoplasm of colon: Secondary | ICD-10-CM | POA: Diagnosis not present

## 2017-06-11 IMAGING — CR DG THORACIC SPINE 3V
3 series · 3 of 3 positions shown · non-contrast
Comparison: 12/20/2014

CLINICAL DATA: Possible injury lifting heavy object 1 week ago,
back pain, neck pain

EXAM:
THORACIC SPINE - 3 VIEWS

[w t-spine a.p. *]
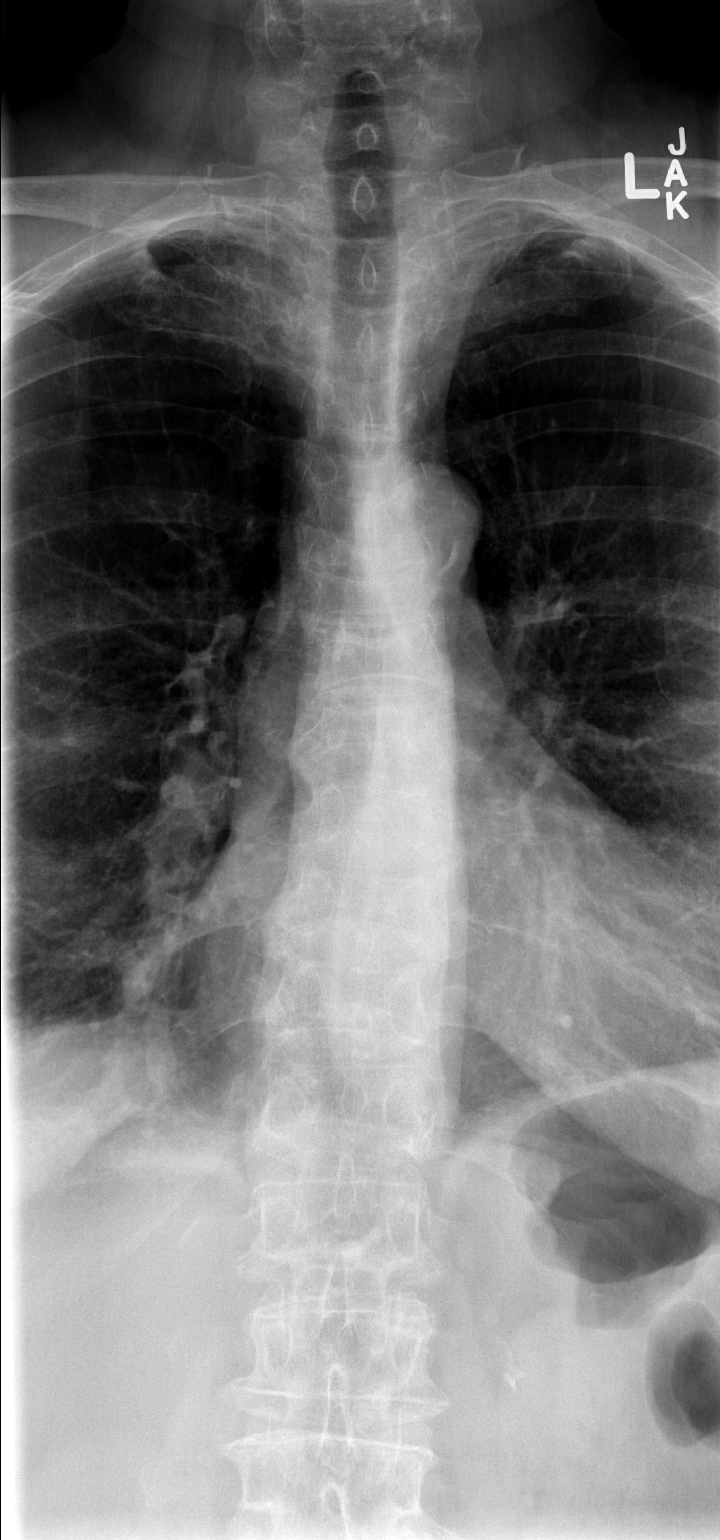

[w t-spine lat *]
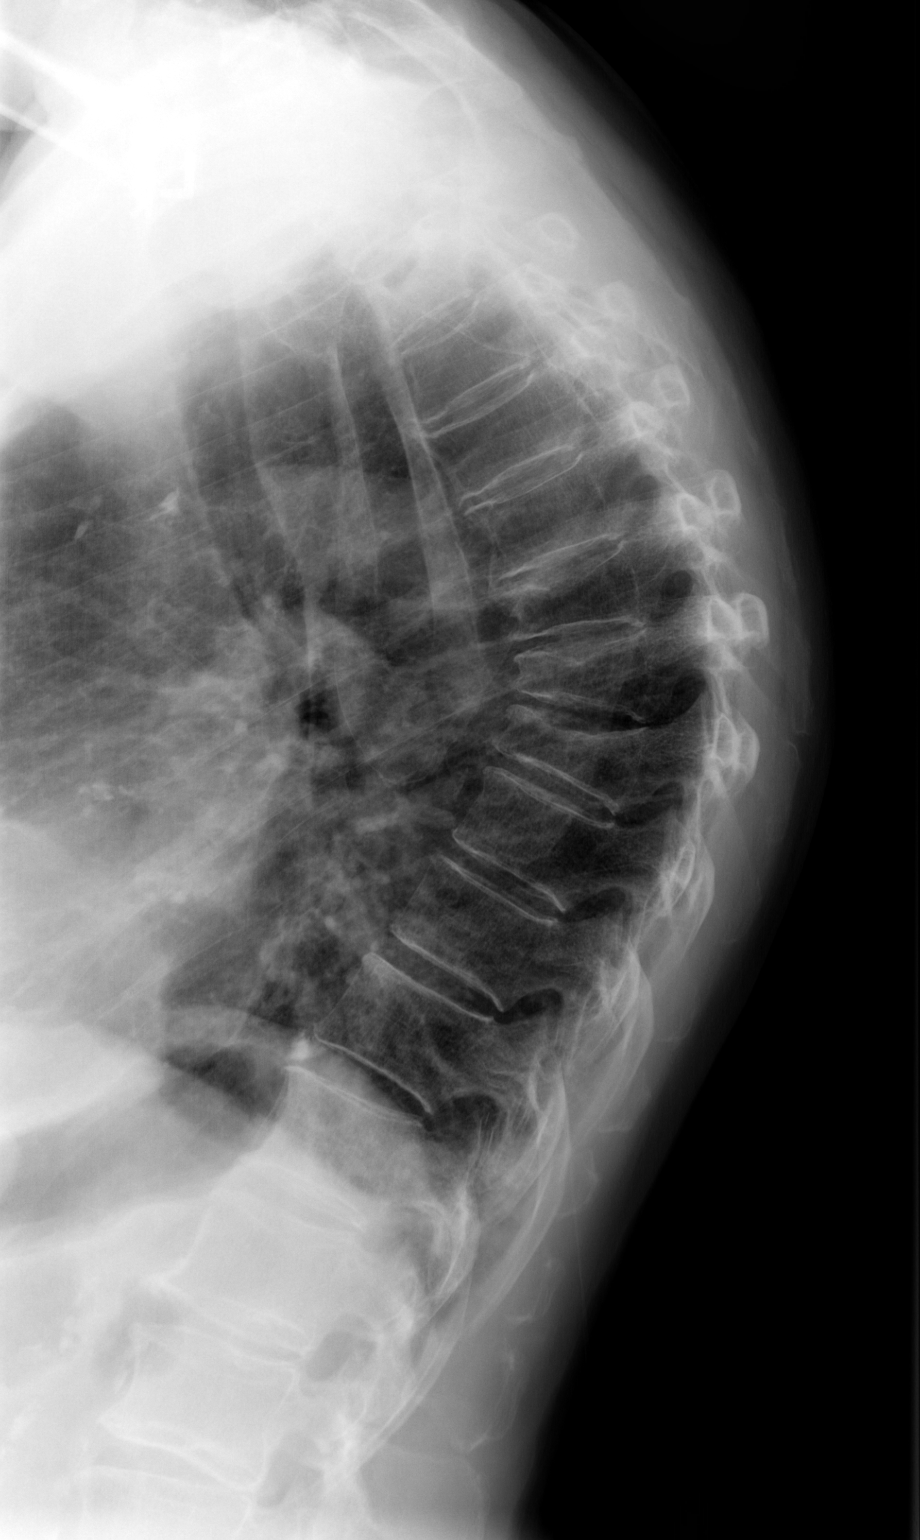

[w swimmers view *]
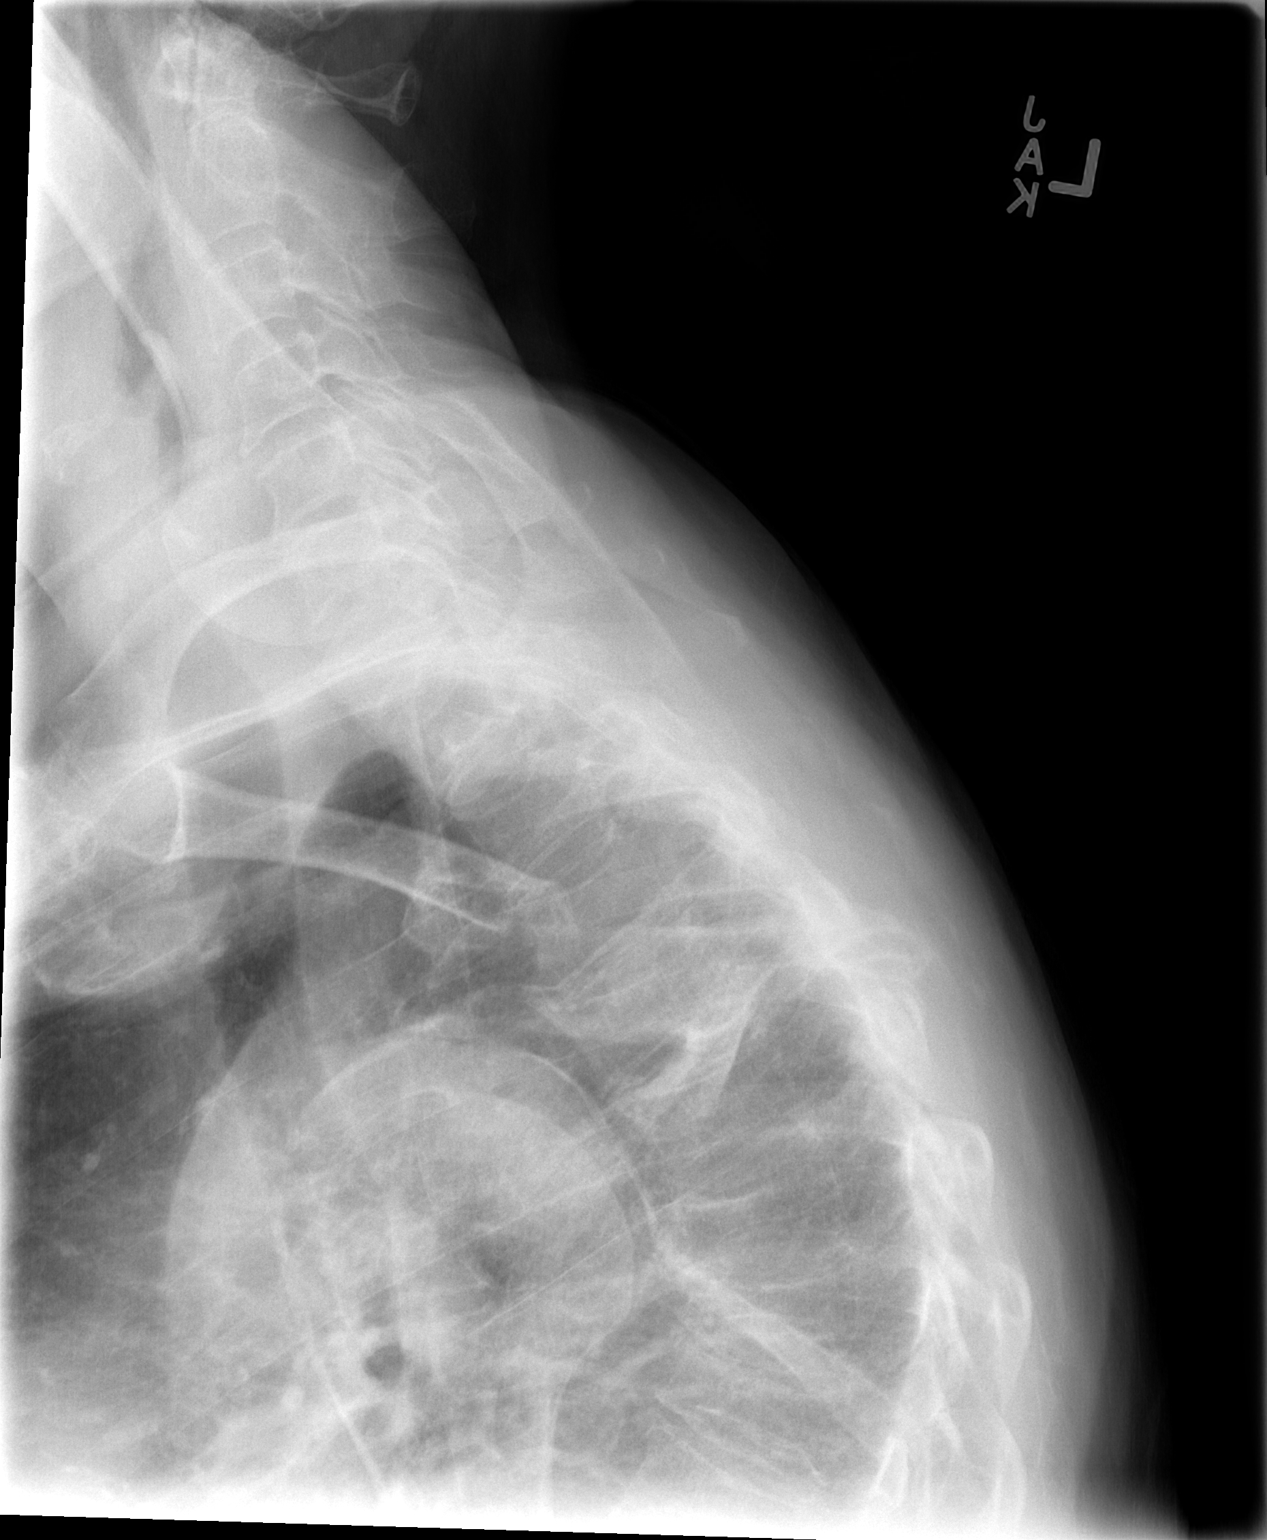

[3 of 3 positions shown; findings below may reference images not displayed]

FINDINGS: Three views of thoracic spine submitted. There is diffuse
osteopenia. Again noted moderate compression fracture mid thoracic
spine probable T7 and T8 vertebral body. There is interval moderate
compression of probable T9 vertebral body upper endplate. Recent
fracture cannot be excluded. Clinical correlation is necessary.
Further correlation with MRI could be performed as clinically
warranted.
IMPRESSION: There is diffuse osteopenia. Again noted moderate compression
fracture mid thoracic spine probable T7 and T8 vertebral body. There
is interval moderate compression of probable T9 vertebral body upper
endplate. Recent fracture cannot be excluded. Clinical correlation
is necessary. Further correlation with MRI could be performed as
clinically warranted.

## 2017-07-07 DIAGNOSIS — Z79891 Long term (current) use of opiate analgesic: Secondary | ICD-10-CM | POA: Diagnosis not present

## 2017-07-07 DIAGNOSIS — J449 Chronic obstructive pulmonary disease, unspecified: Secondary | ICD-10-CM | POA: Diagnosis not present

## 2017-07-07 DIAGNOSIS — D72829 Elevated white blood cell count, unspecified: Secondary | ICD-10-CM | POA: Diagnosis not present

## 2017-07-07 DIAGNOSIS — R000 Tachycardia, unspecified: Secondary | ICD-10-CM | POA: Diagnosis not present

## 2017-07-07 DIAGNOSIS — G894 Chronic pain syndrome: Secondary | ICD-10-CM | POA: Diagnosis not present

## 2017-07-07 DIAGNOSIS — Z681 Body mass index (BMI) 19 or less, adult: Secondary | ICD-10-CM | POA: Diagnosis not present

## 2017-07-07 DIAGNOSIS — E785 Hyperlipidemia, unspecified: Secondary | ICD-10-CM | POA: Diagnosis not present

## 2019-10-10 DIAGNOSIS — Z79891 Long term (current) use of opiate analgesic: Secondary | ICD-10-CM | POA: Diagnosis not present

## 2019-10-10 DIAGNOSIS — G894 Chronic pain syndrome: Secondary | ICD-10-CM | POA: Diagnosis not present

## 2019-11-10 DIAGNOSIS — G894 Chronic pain syndrome: Secondary | ICD-10-CM | POA: Diagnosis not present

## 2019-12-07 DIAGNOSIS — E785 Hyperlipidemia, unspecified: Secondary | ICD-10-CM | POA: Diagnosis not present

## 2019-12-07 DIAGNOSIS — M810 Age-related osteoporosis without current pathological fracture: Secondary | ICD-10-CM | POA: Diagnosis not present

## 2019-12-07 DIAGNOSIS — G894 Chronic pain syndrome: Secondary | ICD-10-CM | POA: Diagnosis not present

## 2019-12-07 DIAGNOSIS — M79674 Pain in right toe(s): Secondary | ICD-10-CM | POA: Diagnosis not present

## 2019-12-07 DIAGNOSIS — D72829 Elevated white blood cell count, unspecified: Secondary | ICD-10-CM | POA: Diagnosis not present

## 2020-01-08 DIAGNOSIS — J449 Chronic obstructive pulmonary disease, unspecified: Secondary | ICD-10-CM | POA: Diagnosis not present

## 2020-01-08 DIAGNOSIS — Z79891 Long term (current) use of opiate analgesic: Secondary | ICD-10-CM | POA: Diagnosis not present

## 2020-01-08 DIAGNOSIS — G894 Chronic pain syndrome: Secondary | ICD-10-CM | POA: Diagnosis not present

## 2020-01-11 DIAGNOSIS — M810 Age-related osteoporosis without current pathological fracture: Secondary | ICD-10-CM | POA: Diagnosis not present

## 2020-02-15 DIAGNOSIS — G894 Chronic pain syndrome: Secondary | ICD-10-CM | POA: Diagnosis not present

## 2020-03-29 DIAGNOSIS — I10 Essential (primary) hypertension: Secondary | ICD-10-CM | POA: Diagnosis not present

## 2020-04-18 DIAGNOSIS — Z23 Encounter for immunization: Secondary | ICD-10-CM | POA: Diagnosis not present

## 2020-04-18 DIAGNOSIS — F17200 Nicotine dependence, unspecified, uncomplicated: Secondary | ICD-10-CM | POA: Diagnosis not present

## 2020-04-18 DIAGNOSIS — Z681 Body mass index (BMI) 19 or less, adult: Secondary | ICD-10-CM | POA: Diagnosis not present

## 2020-04-18 DIAGNOSIS — Z79891 Long term (current) use of opiate analgesic: Secondary | ICD-10-CM | POA: Diagnosis not present

## 2020-04-18 DIAGNOSIS — G894 Chronic pain syndrome: Secondary | ICD-10-CM | POA: Diagnosis not present

## 2020-09-02 DIAGNOSIS — J449 Chronic obstructive pulmonary disease, unspecified: Secondary | ICD-10-CM | POA: Diagnosis not present

## 2020-09-02 DIAGNOSIS — Z79891 Long term (current) use of opiate analgesic: Secondary | ICD-10-CM | POA: Diagnosis not present

## 2020-09-02 DIAGNOSIS — G894 Chronic pain syndrome: Secondary | ICD-10-CM | POA: Diagnosis not present

## 2020-09-03 DIAGNOSIS — J449 Chronic obstructive pulmonary disease, unspecified: Secondary | ICD-10-CM | POA: Diagnosis not present

## 2020-09-11 DIAGNOSIS — J449 Chronic obstructive pulmonary disease, unspecified: Secondary | ICD-10-CM | POA: Diagnosis not present

## 2020-10-01 DIAGNOSIS — J449 Chronic obstructive pulmonary disease, unspecified: Secondary | ICD-10-CM | POA: Diagnosis not present

## 2020-10-24 DIAGNOSIS — Z681 Body mass index (BMI) 19 or less, adult: Secondary | ICD-10-CM | POA: Diagnosis not present

## 2020-10-24 DIAGNOSIS — J441 Chronic obstructive pulmonary disease with (acute) exacerbation: Secondary | ICD-10-CM | POA: Diagnosis not present

## 2020-10-31 DIAGNOSIS — Z681 Body mass index (BMI) 19 or less, adult: Secondary | ICD-10-CM | POA: Diagnosis not present

## 2020-10-31 DIAGNOSIS — J449 Chronic obstructive pulmonary disease, unspecified: Secondary | ICD-10-CM | POA: Diagnosis not present

## 2020-10-31 DIAGNOSIS — G894 Chronic pain syndrome: Secondary | ICD-10-CM | POA: Diagnosis not present

## 2020-11-01 DIAGNOSIS — J449 Chronic obstructive pulmonary disease, unspecified: Secondary | ICD-10-CM | POA: Diagnosis not present

## 2020-11-02 DIAGNOSIS — I493 Ventricular premature depolarization: Secondary | ICD-10-CM | POA: Diagnosis not present

## 2020-11-02 DIAGNOSIS — R069 Unspecified abnormalities of breathing: Secondary | ICD-10-CM | POA: Diagnosis not present

## 2020-11-02 DIAGNOSIS — Z9981 Dependence on supplemental oxygen: Secondary | ICD-10-CM | POA: Diagnosis not present

## 2020-11-02 DIAGNOSIS — Z7982 Long term (current) use of aspirin: Secondary | ICD-10-CM | POA: Diagnosis not present

## 2020-11-02 DIAGNOSIS — J439 Emphysema, unspecified: Secondary | ICD-10-CM | POA: Diagnosis not present

## 2020-11-02 DIAGNOSIS — F17210 Nicotine dependence, cigarettes, uncomplicated: Secondary | ICD-10-CM | POA: Diagnosis not present

## 2020-11-02 DIAGNOSIS — J441 Chronic obstructive pulmonary disease with (acute) exacerbation: Secondary | ICD-10-CM | POA: Diagnosis not present

## 2020-11-02 DIAGNOSIS — Z7951 Long term (current) use of inhaled steroids: Secondary | ICD-10-CM | POA: Diagnosis not present

## 2020-11-02 DIAGNOSIS — Z87891 Personal history of nicotine dependence: Secondary | ICD-10-CM | POA: Diagnosis not present

## 2020-11-02 DIAGNOSIS — Z79899 Other long term (current) drug therapy: Secondary | ICD-10-CM | POA: Diagnosis not present

## 2020-11-02 DIAGNOSIS — R0600 Dyspnea, unspecified: Secondary | ICD-10-CM | POA: Diagnosis not present

## 2020-11-02 DIAGNOSIS — R000 Tachycardia, unspecified: Secondary | ICD-10-CM | POA: Diagnosis not present

## 2020-11-02 DIAGNOSIS — I10 Essential (primary) hypertension: Secondary | ICD-10-CM | POA: Diagnosis not present

## 2020-11-02 DIAGNOSIS — R0602 Shortness of breath: Secondary | ICD-10-CM | POA: Diagnosis not present

## 2020-11-02 DIAGNOSIS — J90 Pleural effusion, not elsewhere classified: Secondary | ICD-10-CM | POA: Diagnosis not present

## 2020-11-02 DIAGNOSIS — Z20822 Contact with and (suspected) exposure to covid-19: Secondary | ICD-10-CM | POA: Diagnosis not present

## 2020-11-12 DIAGNOSIS — J441 Chronic obstructive pulmonary disease with (acute) exacerbation: Secondary | ICD-10-CM | POA: Diagnosis not present

## 2020-11-12 DIAGNOSIS — F17200 Nicotine dependence, unspecified, uncomplicated: Secondary | ICD-10-CM | POA: Diagnosis not present

## 2020-11-12 DIAGNOSIS — J9611 Chronic respiratory failure with hypoxia: Secondary | ICD-10-CM | POA: Diagnosis not present

## 2020-11-12 DIAGNOSIS — Z681 Body mass index (BMI) 19 or less, adult: Secondary | ICD-10-CM | POA: Diagnosis not present

## 2020-11-13 DIAGNOSIS — J449 Chronic obstructive pulmonary disease, unspecified: Secondary | ICD-10-CM | POA: Diagnosis not present

## 2020-12-01 DIAGNOSIS — J449 Chronic obstructive pulmonary disease, unspecified: Secondary | ICD-10-CM | POA: Diagnosis not present

## 2020-12-13 DIAGNOSIS — E785 Hyperlipidemia, unspecified: Secondary | ICD-10-CM | POA: Diagnosis not present

## 2020-12-13 DIAGNOSIS — I10 Essential (primary) hypertension: Secondary | ICD-10-CM | POA: Diagnosis not present

## 2020-12-13 DIAGNOSIS — G894 Chronic pain syndrome: Secondary | ICD-10-CM | POA: Diagnosis not present

## 2020-12-13 DIAGNOSIS — J449 Chronic obstructive pulmonary disease, unspecified: Secondary | ICD-10-CM | POA: Diagnosis not present

## 2020-12-13 DIAGNOSIS — Z681 Body mass index (BMI) 19 or less, adult: Secondary | ICD-10-CM | POA: Diagnosis not present

## 2020-12-13 DIAGNOSIS — M810 Age-related osteoporosis without current pathological fracture: Secondary | ICD-10-CM | POA: Diagnosis not present

## 2020-12-13 DIAGNOSIS — R634 Abnormal weight loss: Secondary | ICD-10-CM | POA: Diagnosis not present

## 2020-12-13 DIAGNOSIS — J9611 Chronic respiratory failure with hypoxia: Secondary | ICD-10-CM | POA: Diagnosis not present

## 2020-12-13 DIAGNOSIS — Z79891 Long term (current) use of opiate analgesic: Secondary | ICD-10-CM | POA: Diagnosis not present

## 2020-12-13 DIAGNOSIS — R739 Hyperglycemia, unspecified: Secondary | ICD-10-CM | POA: Diagnosis not present

## 2020-12-17 DIAGNOSIS — Z1231 Encounter for screening mammogram for malignant neoplasm of breast: Secondary | ICD-10-CM | POA: Diagnosis not present

## 2021-01-01 DIAGNOSIS — J449 Chronic obstructive pulmonary disease, unspecified: Secondary | ICD-10-CM | POA: Diagnosis not present

## 2021-01-13 DIAGNOSIS — J449 Chronic obstructive pulmonary disease, unspecified: Secondary | ICD-10-CM | POA: Diagnosis not present

## 2021-01-30 DIAGNOSIS — M810 Age-related osteoporosis without current pathological fracture: Secondary | ICD-10-CM | POA: Diagnosis not present

## 2021-01-30 DIAGNOSIS — J449 Chronic obstructive pulmonary disease, unspecified: Secondary | ICD-10-CM | POA: Diagnosis not present

## 2021-01-30 DIAGNOSIS — G894 Chronic pain syndrome: Secondary | ICD-10-CM | POA: Diagnosis not present

## 2021-01-30 DIAGNOSIS — Z681 Body mass index (BMI) 19 or less, adult: Secondary | ICD-10-CM | POA: Diagnosis not present

## 2021-01-31 DIAGNOSIS — J449 Chronic obstructive pulmonary disease, unspecified: Secondary | ICD-10-CM | POA: Diagnosis not present

## 2021-02-06 DIAGNOSIS — M810 Age-related osteoporosis without current pathological fracture: Secondary | ICD-10-CM | POA: Diagnosis not present

## 2021-02-12 DIAGNOSIS — J449 Chronic obstructive pulmonary disease, unspecified: Secondary | ICD-10-CM | POA: Diagnosis not present

## 2021-03-03 DIAGNOSIS — J449 Chronic obstructive pulmonary disease, unspecified: Secondary | ICD-10-CM | POA: Diagnosis not present

## 2021-03-15 DIAGNOSIS — J449 Chronic obstructive pulmonary disease, unspecified: Secondary | ICD-10-CM | POA: Diagnosis not present

## 2021-03-17 DIAGNOSIS — J449 Chronic obstructive pulmonary disease, unspecified: Secondary | ICD-10-CM | POA: Diagnosis not present

## 2021-03-17 DIAGNOSIS — D72829 Elevated white blood cell count, unspecified: Secondary | ICD-10-CM | POA: Diagnosis not present

## 2021-03-17 DIAGNOSIS — J9611 Chronic respiratory failure with hypoxia: Secondary | ICD-10-CM | POA: Diagnosis not present

## 2021-03-17 DIAGNOSIS — Z681 Body mass index (BMI) 19 or less, adult: Secondary | ICD-10-CM | POA: Diagnosis not present

## 2021-03-17 DIAGNOSIS — G894 Chronic pain syndrome: Secondary | ICD-10-CM | POA: Diagnosis not present

## 2021-03-17 DIAGNOSIS — Z9981 Dependence on supplemental oxygen: Secondary | ICD-10-CM | POA: Diagnosis not present

## 2021-03-17 DIAGNOSIS — M810 Age-related osteoporosis without current pathological fracture: Secondary | ICD-10-CM | POA: Diagnosis not present

## 2021-03-17 DIAGNOSIS — R739 Hyperglycemia, unspecified: Secondary | ICD-10-CM | POA: Diagnosis not present

## 2021-03-17 DIAGNOSIS — I10 Essential (primary) hypertension: Secondary | ICD-10-CM | POA: Diagnosis not present

## 2021-03-17 DIAGNOSIS — Z79891 Long term (current) use of opiate analgesic: Secondary | ICD-10-CM | POA: Diagnosis not present

## 2021-03-17 DIAGNOSIS — E785 Hyperlipidemia, unspecified: Secondary | ICD-10-CM | POA: Diagnosis not present

## 2021-04-03 DIAGNOSIS — J449 Chronic obstructive pulmonary disease, unspecified: Secondary | ICD-10-CM | POA: Diagnosis not present

## 2021-04-15 DIAGNOSIS — Z681 Body mass index (BMI) 19 or less, adult: Secondary | ICD-10-CM | POA: Diagnosis not present

## 2021-04-15 DIAGNOSIS — D692 Other nonthrombocytopenic purpura: Secondary | ICD-10-CM | POA: Diagnosis not present

## 2021-04-15 DIAGNOSIS — J441 Chronic obstructive pulmonary disease with (acute) exacerbation: Secondary | ICD-10-CM | POA: Diagnosis not present

## 2021-04-15 DIAGNOSIS — J449 Chronic obstructive pulmonary disease, unspecified: Secondary | ICD-10-CM | POA: Diagnosis not present

## 2021-04-28 DIAGNOSIS — Z681 Body mass index (BMI) 19 or less, adult: Secondary | ICD-10-CM | POA: Diagnosis not present

## 2021-04-28 DIAGNOSIS — G894 Chronic pain syndrome: Secondary | ICD-10-CM | POA: Diagnosis not present

## 2021-04-28 DIAGNOSIS — Z23 Encounter for immunization: Secondary | ICD-10-CM | POA: Diagnosis not present

## 2021-04-28 DIAGNOSIS — J449 Chronic obstructive pulmonary disease, unspecified: Secondary | ICD-10-CM | POA: Diagnosis not present

## 2021-05-03 DIAGNOSIS — J449 Chronic obstructive pulmonary disease, unspecified: Secondary | ICD-10-CM | POA: Diagnosis not present

## 2021-05-15 DIAGNOSIS — J449 Chronic obstructive pulmonary disease, unspecified: Secondary | ICD-10-CM | POA: Diagnosis not present

## 2021-06-03 DIAGNOSIS — J449 Chronic obstructive pulmonary disease, unspecified: Secondary | ICD-10-CM | POA: Diagnosis not present

## 2021-06-09 DIAGNOSIS — E46 Unspecified protein-calorie malnutrition: Secondary | ICD-10-CM | POA: Diagnosis not present

## 2021-06-09 DIAGNOSIS — Z681 Body mass index (BMI) 19 or less, adult: Secondary | ICD-10-CM | POA: Diagnosis not present

## 2021-06-09 DIAGNOSIS — J449 Chronic obstructive pulmonary disease, unspecified: Secondary | ICD-10-CM | POA: Diagnosis not present

## 2021-06-09 DIAGNOSIS — G894 Chronic pain syndrome: Secondary | ICD-10-CM | POA: Diagnosis not present

## 2021-06-15 DIAGNOSIS — J449 Chronic obstructive pulmonary disease, unspecified: Secondary | ICD-10-CM | POA: Diagnosis not present

## 2021-07-03 DIAGNOSIS — J449 Chronic obstructive pulmonary disease, unspecified: Secondary | ICD-10-CM | POA: Diagnosis not present

## 2021-07-15 DIAGNOSIS — J449 Chronic obstructive pulmonary disease, unspecified: Secondary | ICD-10-CM | POA: Diagnosis not present

## 2021-07-25 DIAGNOSIS — J449 Chronic obstructive pulmonary disease, unspecified: Secondary | ICD-10-CM | POA: Diagnosis not present

## 2021-07-25 DIAGNOSIS — E785 Hyperlipidemia, unspecified: Secondary | ICD-10-CM | POA: Diagnosis not present

## 2021-07-25 DIAGNOSIS — Z79891 Long term (current) use of opiate analgesic: Secondary | ICD-10-CM | POA: Diagnosis not present

## 2021-07-25 DIAGNOSIS — M810 Age-related osteoporosis without current pathological fracture: Secondary | ICD-10-CM | POA: Diagnosis not present

## 2021-07-25 DIAGNOSIS — R300 Dysuria: Secondary | ICD-10-CM | POA: Diagnosis not present

## 2021-07-25 DIAGNOSIS — J9611 Chronic respiratory failure with hypoxia: Secondary | ICD-10-CM | POA: Diagnosis not present

## 2021-07-25 DIAGNOSIS — Z681 Body mass index (BMI) 19 or less, adult: Secondary | ICD-10-CM | POA: Diagnosis not present

## 2021-07-25 DIAGNOSIS — G894 Chronic pain syndrome: Secondary | ICD-10-CM | POA: Diagnosis not present

## 2021-07-25 DIAGNOSIS — E46 Unspecified protein-calorie malnutrition: Secondary | ICD-10-CM | POA: Diagnosis not present

## 2021-07-25 DIAGNOSIS — I10 Essential (primary) hypertension: Secondary | ICD-10-CM | POA: Diagnosis not present

## 2021-07-25 DIAGNOSIS — D72829 Elevated white blood cell count, unspecified: Secondary | ICD-10-CM | POA: Diagnosis not present

## 2021-07-25 DIAGNOSIS — R739 Hyperglycemia, unspecified: Secondary | ICD-10-CM | POA: Diagnosis not present

## 2021-08-03 DIAGNOSIS — J449 Chronic obstructive pulmonary disease, unspecified: Secondary | ICD-10-CM | POA: Diagnosis not present

## 2021-08-15 DIAGNOSIS — J449 Chronic obstructive pulmonary disease, unspecified: Secondary | ICD-10-CM | POA: Diagnosis not present

## 2021-09-03 DIAGNOSIS — J449 Chronic obstructive pulmonary disease, unspecified: Secondary | ICD-10-CM | POA: Diagnosis not present

## 2021-09-05 DIAGNOSIS — M810 Age-related osteoporosis without current pathological fracture: Secondary | ICD-10-CM | POA: Diagnosis not present

## 2021-09-05 DIAGNOSIS — Z681 Body mass index (BMI) 19 or less, adult: Secondary | ICD-10-CM | POA: Diagnosis not present

## 2021-09-05 DIAGNOSIS — G894 Chronic pain syndrome: Secondary | ICD-10-CM | POA: Diagnosis not present

## 2021-09-05 DIAGNOSIS — Z9181 History of falling: Secondary | ICD-10-CM | POA: Diagnosis not present

## 2021-09-12 DIAGNOSIS — J441 Chronic obstructive pulmonary disease with (acute) exacerbation: Secondary | ICD-10-CM | POA: Diagnosis not present

## 2021-09-15 DIAGNOSIS — J449 Chronic obstructive pulmonary disease, unspecified: Secondary | ICD-10-CM | POA: Diagnosis not present

## 2021-09-22 DIAGNOSIS — M940 Chondrocostal junction syndrome [Tietze]: Secondary | ICD-10-CM | POA: Diagnosis not present

## 2021-09-22 DIAGNOSIS — M810 Age-related osteoporosis without current pathological fracture: Secondary | ICD-10-CM | POA: Diagnosis not present

## 2021-10-01 DIAGNOSIS — J449 Chronic obstructive pulmonary disease, unspecified: Secondary | ICD-10-CM | POA: Diagnosis not present

## 2021-10-13 DIAGNOSIS — J449 Chronic obstructive pulmonary disease, unspecified: Secondary | ICD-10-CM | POA: Diagnosis not present

## 2021-11-01 DIAGNOSIS — J449 Chronic obstructive pulmonary disease, unspecified: Secondary | ICD-10-CM | POA: Diagnosis not present

## 2021-11-03 DIAGNOSIS — G894 Chronic pain syndrome: Secondary | ICD-10-CM | POA: Diagnosis not present

## 2021-11-03 DIAGNOSIS — M940 Chondrocostal junction syndrome [Tietze]: Secondary | ICD-10-CM | POA: Diagnosis not present

## 2021-11-03 DIAGNOSIS — Z681 Body mass index (BMI) 19 or less, adult: Secondary | ICD-10-CM | POA: Diagnosis not present

## 2021-11-13 DIAGNOSIS — J449 Chronic obstructive pulmonary disease, unspecified: Secondary | ICD-10-CM | POA: Diagnosis not present

## 2021-12-01 DIAGNOSIS — J449 Chronic obstructive pulmonary disease, unspecified: Secondary | ICD-10-CM | POA: Diagnosis not present

## 2021-12-13 DIAGNOSIS — J449 Chronic obstructive pulmonary disease, unspecified: Secondary | ICD-10-CM | POA: Diagnosis not present

## 2022-01-01 DIAGNOSIS — J449 Chronic obstructive pulmonary disease, unspecified: Secondary | ICD-10-CM | POA: Diagnosis not present

## 2022-01-05 DIAGNOSIS — G894 Chronic pain syndrome: Secondary | ICD-10-CM | POA: Diagnosis not present

## 2022-01-05 DIAGNOSIS — Z9981 Dependence on supplemental oxygen: Secondary | ICD-10-CM | POA: Diagnosis not present

## 2022-01-05 DIAGNOSIS — R739 Hyperglycemia, unspecified: Secondary | ICD-10-CM | POA: Diagnosis not present

## 2022-01-05 DIAGNOSIS — J9611 Chronic respiratory failure with hypoxia: Secondary | ICD-10-CM | POA: Diagnosis not present

## 2022-01-05 DIAGNOSIS — D72829 Elevated white blood cell count, unspecified: Secondary | ICD-10-CM | POA: Diagnosis not present

## 2022-01-05 DIAGNOSIS — Z139 Encounter for screening, unspecified: Secondary | ICD-10-CM | POA: Diagnosis not present

## 2022-01-05 DIAGNOSIS — J449 Chronic obstructive pulmonary disease, unspecified: Secondary | ICD-10-CM | POA: Diagnosis not present

## 2022-01-05 DIAGNOSIS — E785 Hyperlipidemia, unspecified: Secondary | ICD-10-CM | POA: Diagnosis not present

## 2022-01-05 DIAGNOSIS — E46 Unspecified protein-calorie malnutrition: Secondary | ICD-10-CM | POA: Diagnosis not present

## 2022-01-05 DIAGNOSIS — M810 Age-related osteoporosis without current pathological fracture: Secondary | ICD-10-CM | POA: Diagnosis not present

## 2022-01-05 DIAGNOSIS — I10 Essential (primary) hypertension: Secondary | ICD-10-CM | POA: Diagnosis not present

## 2022-01-05 DIAGNOSIS — Z79891 Long term (current) use of opiate analgesic: Secondary | ICD-10-CM | POA: Diagnosis not present

## 2022-01-13 DIAGNOSIS — J449 Chronic obstructive pulmonary disease, unspecified: Secondary | ICD-10-CM | POA: Diagnosis not present

## 2022-01-31 DIAGNOSIS — J449 Chronic obstructive pulmonary disease, unspecified: Secondary | ICD-10-CM | POA: Diagnosis not present

## 2022-02-12 DIAGNOSIS — J449 Chronic obstructive pulmonary disease, unspecified: Secondary | ICD-10-CM | POA: Diagnosis not present

## 2022-03-03 DIAGNOSIS — J449 Chronic obstructive pulmonary disease, unspecified: Secondary | ICD-10-CM | POA: Diagnosis not present

## 2022-03-15 DIAGNOSIS — J449 Chronic obstructive pulmonary disease, unspecified: Secondary | ICD-10-CM | POA: Diagnosis not present

## 2022-03-17 DIAGNOSIS — Z139 Encounter for screening, unspecified: Secondary | ICD-10-CM | POA: Diagnosis not present

## 2022-03-17 DIAGNOSIS — G894 Chronic pain syndrome: Secondary | ICD-10-CM | POA: Diagnosis not present

## 2022-03-17 DIAGNOSIS — E46 Unspecified protein-calorie malnutrition: Secondary | ICD-10-CM | POA: Diagnosis not present

## 2022-03-17 DIAGNOSIS — M810 Age-related osteoporosis without current pathological fracture: Secondary | ICD-10-CM | POA: Diagnosis not present

## 2022-03-24 DIAGNOSIS — T300 Burn of unspecified body region, unspecified degree: Secondary | ICD-10-CM | POA: Diagnosis not present

## 2022-03-24 DIAGNOSIS — R609 Edema, unspecified: Secondary | ICD-10-CM | POA: Diagnosis not present

## 2022-03-24 DIAGNOSIS — T2000XA Burn of unspecified degree of head, face, and neck, unspecified site, initial encounter: Secondary | ICD-10-CM | POA: Diagnosis not present

## 2022-03-24 DIAGNOSIS — T2012XA Burn of first degree of lip(s), initial encounter: Secondary | ICD-10-CM | POA: Diagnosis not present

## 2022-03-24 DIAGNOSIS — T310 Burns involving less than 10% of body surface: Secondary | ICD-10-CM | POA: Diagnosis not present

## 2022-03-24 DIAGNOSIS — J439 Emphysema, unspecified: Secondary | ICD-10-CM | POA: Diagnosis not present

## 2022-03-24 DIAGNOSIS — T2014XA Burn of first degree of nose (septum), initial encounter: Secondary | ICD-10-CM | POA: Diagnosis not present

## 2022-03-24 DIAGNOSIS — J449 Chronic obstructive pulmonary disease, unspecified: Secondary | ICD-10-CM | POA: Diagnosis not present

## 2022-03-24 DIAGNOSIS — Z743 Need for continuous supervision: Secondary | ICD-10-CM | POA: Diagnosis not present

## 2022-04-03 DIAGNOSIS — J449 Chronic obstructive pulmonary disease, unspecified: Secondary | ICD-10-CM | POA: Diagnosis not present

## 2022-04-03 DIAGNOSIS — M810 Age-related osteoporosis without current pathological fracture: Secondary | ICD-10-CM | POA: Diagnosis not present

## 2022-04-03 DIAGNOSIS — T2020XA Burn of second degree of head, face, and neck, unspecified site, initial encounter: Secondary | ICD-10-CM | POA: Diagnosis not present

## 2022-04-15 DIAGNOSIS — J449 Chronic obstructive pulmonary disease, unspecified: Secondary | ICD-10-CM | POA: Diagnosis not present

## 2022-04-16 DIAGNOSIS — I10 Essential (primary) hypertension: Secondary | ICD-10-CM | POA: Diagnosis not present

## 2022-04-16 DIAGNOSIS — M940 Chondrocostal junction syndrome [Tietze]: Secondary | ICD-10-CM | POA: Diagnosis not present

## 2022-04-16 DIAGNOSIS — J45998 Other asthma: Secondary | ICD-10-CM | POA: Diagnosis not present

## 2022-05-03 DIAGNOSIS — J449 Chronic obstructive pulmonary disease, unspecified: Secondary | ICD-10-CM | POA: Diagnosis not present

## 2022-05-15 DIAGNOSIS — J449 Chronic obstructive pulmonary disease, unspecified: Secondary | ICD-10-CM | POA: Diagnosis not present

## 2022-05-22 DIAGNOSIS — Z23 Encounter for immunization: Secondary | ICD-10-CM | POA: Diagnosis not present

## 2022-05-22 DIAGNOSIS — R739 Hyperglycemia, unspecified: Secondary | ICD-10-CM | POA: Diagnosis not present

## 2022-05-22 DIAGNOSIS — D692 Other nonthrombocytopenic purpura: Secondary | ICD-10-CM | POA: Diagnosis not present

## 2022-05-22 DIAGNOSIS — D72829 Elevated white blood cell count, unspecified: Secondary | ICD-10-CM | POA: Diagnosis not present

## 2022-05-22 DIAGNOSIS — J449 Chronic obstructive pulmonary disease, unspecified: Secondary | ICD-10-CM | POA: Diagnosis not present

## 2022-05-22 DIAGNOSIS — Z139 Encounter for screening, unspecified: Secondary | ICD-10-CM | POA: Diagnosis not present

## 2022-05-22 DIAGNOSIS — G894 Chronic pain syndrome: Secondary | ICD-10-CM | POA: Diagnosis not present

## 2022-05-22 DIAGNOSIS — R000 Tachycardia, unspecified: Secondary | ICD-10-CM | POA: Diagnosis not present

## 2022-05-22 DIAGNOSIS — E785 Hyperlipidemia, unspecified: Secondary | ICD-10-CM | POA: Diagnosis not present

## 2022-05-22 DIAGNOSIS — I10 Essential (primary) hypertension: Secondary | ICD-10-CM | POA: Diagnosis not present

## 2022-06-03 DIAGNOSIS — J449 Chronic obstructive pulmonary disease, unspecified: Secondary | ICD-10-CM | POA: Diagnosis not present

## 2022-06-15 DIAGNOSIS — J449 Chronic obstructive pulmonary disease, unspecified: Secondary | ICD-10-CM | POA: Diagnosis not present

## 2022-07-03 DIAGNOSIS — J449 Chronic obstructive pulmonary disease, unspecified: Secondary | ICD-10-CM | POA: Diagnosis not present

## 2022-07-15 DIAGNOSIS — J449 Chronic obstructive pulmonary disease, unspecified: Secondary | ICD-10-CM | POA: Diagnosis not present

## 2022-07-27 DIAGNOSIS — Z79891 Long term (current) use of opiate analgesic: Secondary | ICD-10-CM | POA: Diagnosis not present

## 2022-07-27 DIAGNOSIS — G894 Chronic pain syndrome: Secondary | ICD-10-CM | POA: Diagnosis not present

## 2022-08-03 DIAGNOSIS — J449 Chronic obstructive pulmonary disease, unspecified: Secondary | ICD-10-CM | POA: Diagnosis not present

## 2022-08-15 DIAGNOSIS — J449 Chronic obstructive pulmonary disease, unspecified: Secondary | ICD-10-CM | POA: Diagnosis not present

## 2022-08-30 DIAGNOSIS — R059 Cough, unspecified: Secondary | ICD-10-CM | POA: Diagnosis not present

## 2022-08-30 DIAGNOSIS — E785 Hyperlipidemia, unspecified: Secondary | ICD-10-CM | POA: Diagnosis not present

## 2022-08-30 DIAGNOSIS — Z20822 Contact with and (suspected) exposure to covid-19: Secondary | ICD-10-CM | POA: Diagnosis not present

## 2022-08-30 DIAGNOSIS — J449 Chronic obstructive pulmonary disease, unspecified: Secondary | ICD-10-CM | POA: Diagnosis not present

## 2022-09-03 DIAGNOSIS — J449 Chronic obstructive pulmonary disease, unspecified: Secondary | ICD-10-CM | POA: Diagnosis not present

## 2022-09-15 DIAGNOSIS — J449 Chronic obstructive pulmonary disease, unspecified: Secondary | ICD-10-CM | POA: Diagnosis not present

## 2022-09-25 DIAGNOSIS — Z9181 History of falling: Secondary | ICD-10-CM | POA: Diagnosis not present

## 2022-09-25 DIAGNOSIS — G894 Chronic pain syndrome: Secondary | ICD-10-CM | POA: Diagnosis not present

## 2022-09-25 DIAGNOSIS — E46 Unspecified protein-calorie malnutrition: Secondary | ICD-10-CM | POA: Diagnosis not present

## 2022-09-29 DIAGNOSIS — M810 Age-related osteoporosis without current pathological fracture: Secondary | ICD-10-CM | POA: Diagnosis not present

## 2022-10-02 DIAGNOSIS — J449 Chronic obstructive pulmonary disease, unspecified: Secondary | ICD-10-CM | POA: Diagnosis not present

## 2022-10-05 DIAGNOSIS — J45998 Other asthma: Secondary | ICD-10-CM | POA: Diagnosis not present

## 2022-10-05 DIAGNOSIS — Z20822 Contact with and (suspected) exposure to covid-19: Secondary | ICD-10-CM | POA: Diagnosis not present

## 2022-10-12 DIAGNOSIS — M810 Age-related osteoporosis without current pathological fracture: Secondary | ICD-10-CM | POA: Diagnosis not present

## 2022-10-12 DIAGNOSIS — J449 Chronic obstructive pulmonary disease, unspecified: Secondary | ICD-10-CM | POA: Diagnosis not present

## 2022-10-14 DIAGNOSIS — J449 Chronic obstructive pulmonary disease, unspecified: Secondary | ICD-10-CM | POA: Diagnosis not present

## 2022-10-22 DIAGNOSIS — F17210 Nicotine dependence, cigarettes, uncomplicated: Secondary | ICD-10-CM | POA: Diagnosis not present

## 2022-10-22 DIAGNOSIS — J449 Chronic obstructive pulmonary disease, unspecified: Secondary | ICD-10-CM | POA: Diagnosis not present

## 2022-11-02 DIAGNOSIS — J449 Chronic obstructive pulmonary disease, unspecified: Secondary | ICD-10-CM | POA: Diagnosis not present

## 2022-11-06 DIAGNOSIS — R079 Chest pain, unspecified: Secondary | ICD-10-CM | POA: Diagnosis not present

## 2022-11-06 DIAGNOSIS — Z87891 Personal history of nicotine dependence: Secondary | ICD-10-CM | POA: Diagnosis not present

## 2022-11-06 DIAGNOSIS — J9811 Atelectasis: Secondary | ICD-10-CM | POA: Diagnosis not present

## 2022-11-06 DIAGNOSIS — R0689 Other abnormalities of breathing: Secondary | ICD-10-CM | POA: Diagnosis not present

## 2022-11-06 DIAGNOSIS — R0789 Other chest pain: Secondary | ICD-10-CM | POA: Diagnosis not present

## 2022-11-06 DIAGNOSIS — Z743 Need for continuous supervision: Secondary | ICD-10-CM | POA: Diagnosis not present

## 2022-11-06 DIAGNOSIS — J432 Centrilobular emphysema: Secondary | ICD-10-CM | POA: Diagnosis not present

## 2022-11-06 DIAGNOSIS — R069 Unspecified abnormalities of breathing: Secondary | ICD-10-CM | POA: Diagnosis not present

## 2022-11-06 DIAGNOSIS — I499 Cardiac arrhythmia, unspecified: Secondary | ICD-10-CM | POA: Diagnosis not present

## 2022-11-06 DIAGNOSIS — I2510 Atherosclerotic heart disease of native coronary artery without angina pectoris: Secondary | ICD-10-CM | POA: Diagnosis not present

## 2022-11-06 DIAGNOSIS — R918 Other nonspecific abnormal finding of lung field: Secondary | ICD-10-CM | POA: Diagnosis not present

## 2022-11-06 DIAGNOSIS — Z720 Tobacco use: Secondary | ICD-10-CM | POA: Diagnosis not present

## 2022-11-06 DIAGNOSIS — Z955 Presence of coronary angioplasty implant and graft: Secondary | ICD-10-CM | POA: Diagnosis not present

## 2022-11-06 DIAGNOSIS — Z7982 Long term (current) use of aspirin: Secondary | ICD-10-CM | POA: Diagnosis not present

## 2022-11-06 DIAGNOSIS — Z20822 Contact with and (suspected) exposure to covid-19: Secondary | ICD-10-CM | POA: Diagnosis not present

## 2022-11-06 DIAGNOSIS — J449 Chronic obstructive pulmonary disease, unspecified: Secondary | ICD-10-CM | POA: Diagnosis not present

## 2022-11-07 DIAGNOSIS — J449 Chronic obstructive pulmonary disease, unspecified: Secondary | ICD-10-CM | POA: Diagnosis not present

## 2022-11-12 DIAGNOSIS — F17210 Nicotine dependence, cigarettes, uncomplicated: Secondary | ICD-10-CM | POA: Diagnosis not present

## 2022-11-12 DIAGNOSIS — R634 Abnormal weight loss: Secondary | ICD-10-CM | POA: Diagnosis not present

## 2022-11-12 DIAGNOSIS — J449 Chronic obstructive pulmonary disease, unspecified: Secondary | ICD-10-CM | POA: Diagnosis not present

## 2022-11-14 DIAGNOSIS — J449 Chronic obstructive pulmonary disease, unspecified: Secondary | ICD-10-CM | POA: Diagnosis not present

## 2022-11-24 DIAGNOSIS — Z79891 Long term (current) use of opiate analgesic: Secondary | ICD-10-CM | POA: Diagnosis not present

## 2022-11-24 DIAGNOSIS — R0789 Other chest pain: Secondary | ICD-10-CM | POA: Diagnosis not present

## 2022-11-24 DIAGNOSIS — M8008XA Age-related osteoporosis with current pathological fracture, vertebra(e), initial encounter for fracture: Secondary | ICD-10-CM | POA: Diagnosis not present

## 2022-11-24 DIAGNOSIS — J449 Chronic obstructive pulmonary disease, unspecified: Secondary | ICD-10-CM | POA: Diagnosis not present

## 2022-11-24 DIAGNOSIS — G894 Chronic pain syndrome: Secondary | ICD-10-CM | POA: Diagnosis not present

## 2022-11-25 DIAGNOSIS — R634 Abnormal weight loss: Secondary | ICD-10-CM | POA: Diagnosis not present

## 2022-11-25 DIAGNOSIS — R911 Solitary pulmonary nodule: Secondary | ICD-10-CM | POA: Diagnosis not present

## 2022-11-25 DIAGNOSIS — R918 Other nonspecific abnormal finding of lung field: Secondary | ICD-10-CM | POA: Diagnosis not present

## 2022-11-25 DIAGNOSIS — J432 Centrilobular emphysema: Secondary | ICD-10-CM | POA: Diagnosis not present

## 2022-11-30 DIAGNOSIS — G894 Chronic pain syndrome: Secondary | ICD-10-CM | POA: Diagnosis not present

## 2022-12-02 DIAGNOSIS — J449 Chronic obstructive pulmonary disease, unspecified: Secondary | ICD-10-CM | POA: Diagnosis not present

## 2022-12-10 DIAGNOSIS — I10 Essential (primary) hypertension: Secondary | ICD-10-CM | POA: Diagnosis not present

## 2022-12-10 DIAGNOSIS — G894 Chronic pain syndrome: Secondary | ICD-10-CM | POA: Diagnosis not present

## 2022-12-10 DIAGNOSIS — J449 Chronic obstructive pulmonary disease, unspecified: Secondary | ICD-10-CM | POA: Diagnosis not present

## 2022-12-14 DIAGNOSIS — J449 Chronic obstructive pulmonary disease, unspecified: Secondary | ICD-10-CM | POA: Diagnosis not present

## 2022-12-17 DIAGNOSIS — I2510 Atherosclerotic heart disease of native coronary artery without angina pectoris: Secondary | ICD-10-CM | POA: Diagnosis not present

## 2022-12-17 DIAGNOSIS — J449 Chronic obstructive pulmonary disease, unspecified: Secondary | ICD-10-CM | POA: Diagnosis not present

## 2022-12-17 DIAGNOSIS — R911 Solitary pulmonary nodule: Secondary | ICD-10-CM | POA: Diagnosis not present

## 2022-12-18 DIAGNOSIS — E785 Hyperlipidemia, unspecified: Secondary | ICD-10-CM | POA: Diagnosis not present

## 2022-12-18 DIAGNOSIS — J449 Chronic obstructive pulmonary disease, unspecified: Secondary | ICD-10-CM | POA: Diagnosis not present

## 2023-01-02 DIAGNOSIS — J449 Chronic obstructive pulmonary disease, unspecified: Secondary | ICD-10-CM | POA: Diagnosis not present

## 2023-01-11 DIAGNOSIS — G894 Chronic pain syndrome: Secondary | ICD-10-CM | POA: Diagnosis not present

## 2023-01-11 DIAGNOSIS — J441 Chronic obstructive pulmonary disease with (acute) exacerbation: Secondary | ICD-10-CM | POA: Diagnosis not present

## 2023-01-13 DIAGNOSIS — I2510 Atherosclerotic heart disease of native coronary artery without angina pectoris: Secondary | ICD-10-CM | POA: Diagnosis not present

## 2023-01-14 DIAGNOSIS — J449 Chronic obstructive pulmonary disease, unspecified: Secondary | ICD-10-CM | POA: Diagnosis not present

## 2023-02-01 DIAGNOSIS — J449 Chronic obstructive pulmonary disease, unspecified: Secondary | ICD-10-CM | POA: Diagnosis not present

## 2023-02-04 DIAGNOSIS — R911 Solitary pulmonary nodule: Secondary | ICD-10-CM | POA: Diagnosis not present

## 2023-02-04 DIAGNOSIS — J449 Chronic obstructive pulmonary disease, unspecified: Secondary | ICD-10-CM | POA: Diagnosis not present

## 2023-02-04 DIAGNOSIS — I2510 Atherosclerotic heart disease of native coronary artery without angina pectoris: Secondary | ICD-10-CM | POA: Diagnosis not present

## 2023-02-04 DIAGNOSIS — R634 Abnormal weight loss: Secondary | ICD-10-CM | POA: Diagnosis not present

## 2023-02-07 DIAGNOSIS — J45998 Other asthma: Secondary | ICD-10-CM | POA: Diagnosis not present

## 2023-02-07 DIAGNOSIS — I10 Essential (primary) hypertension: Secondary | ICD-10-CM | POA: Diagnosis not present

## 2023-02-07 DIAGNOSIS — J449 Chronic obstructive pulmonary disease, unspecified: Secondary | ICD-10-CM | POA: Diagnosis not present

## 2023-02-07 DIAGNOSIS — E785 Hyperlipidemia, unspecified: Secondary | ICD-10-CM | POA: Diagnosis not present

## 2023-02-09 ENCOUNTER — Encounter: Payer: Self-pay | Admitting: *Deleted

## 2023-02-12 DIAGNOSIS — J441 Chronic obstructive pulmonary disease with (acute) exacerbation: Secondary | ICD-10-CM | POA: Diagnosis not present

## 2023-02-12 DIAGNOSIS — G894 Chronic pain syndrome: Secondary | ICD-10-CM | POA: Diagnosis not present

## 2023-02-13 DIAGNOSIS — J449 Chronic obstructive pulmonary disease, unspecified: Secondary | ICD-10-CM | POA: Diagnosis not present

## 2023-02-16 DIAGNOSIS — J449 Chronic obstructive pulmonary disease, unspecified: Secondary | ICD-10-CM | POA: Diagnosis not present

## 2023-02-24 DIAGNOSIS — E785 Hyperlipidemia, unspecified: Secondary | ICD-10-CM | POA: Diagnosis not present

## 2023-02-24 DIAGNOSIS — J449 Chronic obstructive pulmonary disease, unspecified: Secondary | ICD-10-CM | POA: Diagnosis not present

## 2023-02-24 DIAGNOSIS — Z20822 Contact with and (suspected) exposure to covid-19: Secondary | ICD-10-CM | POA: Diagnosis not present

## 2023-02-24 DIAGNOSIS — I10 Essential (primary) hypertension: Secondary | ICD-10-CM | POA: Diagnosis not present

## 2023-03-01 ENCOUNTER — Ambulatory Visit: Payer: Medicare Other

## 2023-03-01 VITALS — BP 120/80 | HR 83 | Ht 62.0 in | Wt 94.0 lb

## 2023-03-01 DIAGNOSIS — I071 Rheumatic tricuspid insufficiency: Secondary | ICD-10-CM | POA: Insufficient documentation

## 2023-03-01 DIAGNOSIS — I2510 Atherosclerotic heart disease of native coronary artery without angina pectoris: Secondary | ICD-10-CM | POA: Insufficient documentation

## 2023-03-01 DIAGNOSIS — J449 Chronic obstructive pulmonary disease, unspecified: Secondary | ICD-10-CM

## 2023-03-01 DIAGNOSIS — I272 Pulmonary hypertension, unspecified: Secondary | ICD-10-CM

## 2023-03-01 DIAGNOSIS — I340 Nonrheumatic mitral (valve) insufficiency: Secondary | ICD-10-CM | POA: Insufficient documentation

## 2023-03-01 DIAGNOSIS — I2723 Pulmonary hypertension due to lung diseases and hypoxia: Secondary | ICD-10-CM

## 2023-03-01 HISTORY — DX: Pulmonary hypertension due to lung diseases and hypoxia: I27.23

## 2023-03-01 HISTORY — DX: Rheumatic tricuspid insufficiency: I07.1

## 2023-03-01 HISTORY — DX: Nonrheumatic mitral (valve) insufficiency: I34.0

## 2023-03-01 NOTE — Progress Notes (Signed)
Cardiology Consultation:    Date:  03/01/2023   ID:  DESHIRA VOGELMAN, DOB [birth year], MRN [id number]  PCP:  Cain Saupe, MD  Cardiologist:  Luretha Murphy, MD   Referring MD: Marcellus Scott, MD   No chief complaint on file. Pulmonary hypertension  History of Present Illness:    Victoria Gibson is a 72 y.o. female who is being seen today for the evaluation of pulmonary hypertension and valvular heart disease at the request of Marcellus Scott, MD.   Very pleasant woman lives by herself at home.  Her son lives next door.  She has history of CAD with prior PCI in 2001, COPD [severe, on nocturnal O2 at 2 L/min and uses O2 during ambulation on an as-needed basis], dyslipidemia, mild to moderate MR, moderate to severe TR, mild pulmonary hypertension, former smoker [quit 3 months ago].  Last echocardiogram available to review is from 01/13/2023 at [facility] internal medicine clinics, reported LVEF 55% with mild LVH, normal RV function, mildly dilated left and right atrium, mild to moderate MR with mild mitral annular calcification, moderate to severe TR, mild pulmonary hypertension with estimated RVSP 38 mmHg, mild pulmonary insufficiency and pericardium without any effusion.  The prior CT scan imaging from 5-12/2022 noted atheromatous calcifications of the coronary arteries and aorta, small pericardial effusion with maximum thickness up to 6 mm.  However no effusion on transthoracic echocardiogram as reviewed above.  A 3 mm solitary right upper lobe nodule present.  Pulmonologist follow-up on the lung nodule.  She reports no significant change in her baseline functional status.  She is able to ambulate in the house for day-to-day activities without any limitation.  If she needs to do laundry or vacuuming, at times she tends to use nasal flow oxygen as needed.  She denies any chest pain.  Denies any palpitations, lightheadedness or syncopal episodes.  Denies any pedal edema.  Denies any  orthopnea.  No claudication symptoms.  No blood in urine or stools.  She recently quit smoking about 3 months ago.  Congratulated her.  She consistently takes her medications.  This includes aspirin 81 mg once daily and atorvastatin 40 mg once daily.  She denies any recent flareup of her COPD.     Past Medical History:  Diagnosis Date   Anxiety and depression    CAD (coronary artery disease)    Dr. Jacinto Halim   Colon polyps    COPD (chronic obstructive pulmonary disease) (HCC)    COPD GOLDIV  12/25/2014   Followed in Pulmonary clinic/ [facility]/ [facility]  - 12/24/2014 p extensive coaching HFA effectiveness =    75% > symbicort 160 2bid/ atroven qid maint rx   - PFTs  02/04/2015    FEV1  0.97 (39%) ratio 49 no better p saba after am symbicort  and dlco 36% corrects to 50 for alv vol  - tudorza added 02/04/15      Depression    Hyperglyceridemia    Hyperlipidemia    Hypertension    Osteoarthritis    Osteoporosis    Palpitations    Shingles 09/18/2007   Tobacco abuse    Tubular adenoma 07/21/1999   colonoscopy    Past Surgical History:  Procedure Laterality Date   BREAST BIOPSY     CORONARY STENT PLACEMENT     MASTECTOMY, PARTIAL     TONSILLECTOMY     VAGINAL HYSTERECTOMY      Current Medications: Current Meds  Medication Sig   albuterol (PROVENTIL HFA;VENTOLIN  HFA) 108 (90 BASE) MCG/ACT inhaler Inhale 1-2 puffs into the lungs every 6 (six) hours as needed for wheezing or shortness of breath.   aspirin EC 81 MG tablet Take 81 mg by mouth daily. Swallow whole.   atorvastatin (LIPITOR) 40 MG tablet Take 40 mg by mouth daily.   BREZTRI AEROSPHERE 160-9-4.8 MCG/ACT AERO Inhale 2 puffs into the lungs 2 (two) times daily.   busPIRone (BUSPAR) 5 MG tablet Take 5 mg by mouth 3 (three) times daily.   HYDROcodone-acetaminophen (NORCO) 7.5-325 MG tablet Take 1 tablet by mouth every 6 (six) hours as needed for moderate pain or severe pain.   ipratropium (ATROVENT HFA) 17 MCG/ACT inhaler  Inhale 2 puffs into the lungs every 4 (four) hours as needed for wheezing.   metoprolol succinate (TOPROL-XL) 50 MG 24 hr tablet Take 50 mg by mouth daily. Take with or immediately following a meal.   PROLIA 60 MG/ML SOSY injection Inject 60 mg into the skin every 6 (six) months.     Allergies:   Niacin and related   Social History   Socioeconomic History   Marital status: Single    Spouse name: Not on file   Number of children: Y   Years of education: Not on file   Highest education level: Not on file  Occupational History   Occupation: CNA  Tobacco Use   Smoking status: Former    Current packs/day: 1.50    Average packs/day: 1.5 packs/day for 44.0 years (66.0 ttl pk-yrs)    Types: Cigarettes   Smokeless tobacco: Never  Vaping Use   Vaping status: Never Used  Substance and Sexual Activity   Alcohol use: No    Alcohol/week: 0.0 standard drinks of alcohol   Drug use: No   Sexual activity: Not on file  Other Topics Concern   Not on file  Social History Narrative   Not on file   Social Determinants of Health   Financial Resource Strain: Not on file  Food Insecurity: Not on file  Transportation Needs: Not on file  Physical Activity: Not on file  Stress: Not on file  Social Connections: Not on file     Family History: The patient's family history includes Cancer in her brother; Cervical cancer in her daughter; Colon cancer in her mother; Diabetes in her father; Emphysema in her maternal grandmother; Heart attack in her brother.  Father had a stroke in his 30s and passed away in his 31s. ROS:   Please see the history of present illness.    All 14 point review of systems negative except as described per history of present illness.  EKGs/Labs/Other Studies Reviewed:    The following studies were reviewed today:   EKG:  EKG Interpretation Date/Time:  Monday March 01 2023 13:32:13 EDT Ventricular Rate:  83 PR Interval:  94 QRS Duration:  76 QT Interval:  358 QTC  Calculation: 420 R Axis:   61  Text Interpretation: Sinus rhythm with short PR Otherwise normal ECG When compared with ECG of 20-Nov-2003 10:46, QRS voltage has increased Confirmed by Huntley Dec reddy [phone]) on 03/01/2023 1:52:02 PM    Recent Labs: No results found for requested labs within last 365 days.  Recent Lipid Panel No results found for: "CHOL", "TRIG", "HDL", "CHOLHDL", "VLDL", "LDLCALC", "LDLDIRECT"  Physical Exam:    VS:  BP 120/80 (BP Location: Right Arm, Patient Position: Sitting, Cuff Size: Small)   Pulse 83   Ht 5\' 2"  (1.575 m)   Wt 94  lb (42.6 kg)   SpO2 92%   BMI 17.19 kg/m     Wt Readings from Last 3 Encounters:  03/01/23 94 lb (42.6 kg)  02/04/23 95 lb (43.1 kg)  02/04/15 123 lb (55.8 kg)     GENERAL: Thin built, well developed in no acute distress NECK: No JVD; No carotid bruits CARDIAC: RRR, S1 and S2 present, 3/6 holosystolic murmur left side parasternally. CHEST:  Clear to auscultation without rales, wheezing or rhonchi  Extremities: No pitting pedal edema. Pulses bilaterally symmetric with radial 2+ and dorsalis pedis 2+ NEUROLOGIC:  Alert and oriented x 3   ASSESSMENT AND PLAN:   72 year old woman with history of CAD and prior PCI in 2001 [records not available to review myself], severe COPD, mild pulmonary hypertension presumably group 3, valvular heart disease noted on transthoracic echocardiogram in June 2024 with mild to moderate MR and moderate to severe TR, dyslipidemia, recently quit smoking, has no signs of fluid retention.  Baseline functional status limited due to underlying COPD has not significantly changed over the past year.  Problem List Items Addressed This Visit     COPD GOLDIV  - Primary   Relevant Medications   BREZTRI AEROSPHERE 160-9-4.8 MCG/ACT AERO   Other Relevant Orders   EKG 12-Lead (Completed)   Mild pulmonary hypertension, estimated RVSP 38 mmHg by TTE;    Mild pulmonary hide tension likely secondary to  underlying pulmonary disease, suggestive of group 3 pulmonary hypertension.  Advised her to continue appropriate management of her COPD and prevent flareup of COPD over lung infections and suggestive to continue with vaccinations for pneumococcal pneumonia, influenza as recommended.  No current signs or symptoms of heart failure. Advised about salt restriction to 2 g/day.  She is already following this diligently.       Relevant Medications   aspirin EC 81 MG tablet   Mild to moderate mitral regurgitation by echocardiogram June 2024    Discussed about underlying valvular heart disease noted on echocardiogram. With mild to moderate MR, discussed monitoring with primary transthoracic echocardiogram.       Relevant Medications   aspirin EC 81 MG tablet   Other Relevant Orders   ECHOCARDIOGRAM COMPLETE   Moderate to severe tricuspid regurgitation, by TTE June 2024    Moderate to severe TR in the context of mild pulmonary hypertension COPD reviewed. Will follow-up with repeat transthoracic echocardiogram in 1 year. Salt restriction recommend patient anxiety appropriate COPD management recommendations as above.       Relevant Medications   aspirin EC 81 MG tablet   Other Relevant Orders   ECHOCARDIOGRAM COMPLETE   CAD, prior PCI, unknown vessel in 2001 per patient    No acute change in her functional status. CCS class III predominantly due to underlying pulmonary disease. Consider taking aspirin at statin medications.  Continue aspirin 81 mg once daily and atorvastatin 40 mg once daily.      Relevant Medications   aspirin EC 81 MG tablet   Other Visit Diagnoses     Pulmonary HTN (HCC)       Relevant Medications   aspirin EC 81 MG tablet   Other Relevant Orders   ECHOCARDIOGRAM COMPLETE        Medication Adjustments/Labs and Tests Ordered: Current medicines are reviewed at length with the patient today.  Concerns regarding medicines are outlined above.  Orders Placed  This Encounter  Procedures   EKG 12-Lead   ECHOCARDIOGRAM COMPLETE   No orders of the defined types  were placed in this encounter.   Signed, Cecille Amsterdam, MD, MPH, [facility]. 03/01/2023 2:27 PM    [facility]

## 2023-03-01 NOTE — Assessment & Plan Note (Signed)
Moderate to severe TR in the context of mild pulmonary hypertension COPD reviewed. Will follow-up with repeat transthoracic echocardiogram in 1 year. Salt restriction recommend patient anxiety appropriate COPD management recommendations as above.

## 2023-03-01 NOTE — Assessment & Plan Note (Signed)
Discussed about underlying valvular heart disease noted on echocardiogram. With mild to moderate MR, discussed monitoring with primary transthoracic echocardiogram.

## 2023-03-01 NOTE — Assessment & Plan Note (Signed)
Mild pulmonary hide tension likely secondary to underlying pulmonary disease, suggestive of group 3 pulmonary hypertension.  Advised her to continue appropriate management of her COPD and prevent flareup of COPD over lung infections and suggestive to continue with vaccinations for pneumococcal pneumonia, influenza as recommended.  No current signs or symptoms of heart failure. Advised about salt restriction to 2 g/day.  She is already following this diligently.

## 2023-03-01 NOTE — Assessment & Plan Note (Signed)
No acute change in her functional status. CCS class III predominantly due to underlying pulmonary disease. Consider taking aspirin at statin medications.  Continue aspirin 81 mg once daily and atorvastatin 40 mg once daily.

## 2023-03-01 NOTE — Patient Instructions (Addendum)
Medication Instructions:  Your physician recommends that you continue on your current medications as directed. Please refer to the Current Medication list given to you today.  *If you need a refill on your cardiac medications before your next appointment, please call your pharmacy*   Lab Work: None ordered If you have labs (blood work) drawn today and your tests are completely normal, you will receive your results only by: MyChart Message (if you have MyChart) OR A paper copy in the mail If you have any lab test that is abnormal or we need to change your treatment, we will call you to review the results.   Testing/Procedures: Your physician has requested that you have an echocardiogram. Echocardiography is a painless test that uses sound waves to create images of your heart. It provides your doctor with information about the size and shape of your heart and how well your heart's chambers and valves are working. This procedure takes approximately one hour. There are no restrictions for this procedure. Please do NOT wear cologne, perfume, aftershave, or lotions (deodorant is allowed). Please arrive 15 minutes prior to your appointment time.     Follow-Up: At [facility], you and your health needs are our priority.  As part of our continuing mission to provide you with exceptional heart care, we have created designated Provider Care Teams.  These Care Teams include your primary Cardiologist (physician) and Advanced Practice Providers (APPs -  Physician Assistants and Nurse Practitioners) who all work together to provide you with the care you need, when you need it.  We recommend signing up for the patient portal called "MyChart".  Sign up information is provided on this After Visit Summary.  MyChart is used to connect with patients for Virtual Visits (Telemedicine).  Patients are able to view lab/test results, encounter notes, upcoming appointments, etc.  Non-urgent messages can be sent to  your provider as well.   To learn more about what you can do with MyChart, go to [email or web address].    Your next appointment:   12 month(s)  The format for your next appointment:   In Person  Provider:   Dr. Vincent Gros   Other Instructions Echocardiogram An echocardiogram is a test that uses sound waves (ultrasound) to produce images of the heart. Images from an echocardiogram can provide important information about: Heart size and shape. The size and thickness and movement of your heart's walls. Heart muscle function and strength. Heart valve function or if you have stenosis. Stenosis is when the heart valves are too narrow. If blood is flowing backward through the heart valves (regurgitation). A tumor or infectious growth around the heart valves. Areas of heart muscle that are not working well because of poor blood flow or injury from a heart attack. Aneurysm detection. An aneurysm is a weak or damaged part of an artery wall. The wall bulges out from the normal force of blood pumping through the body. Tell a health care provider about: Any allergies you have. All medicines you are taking, including vitamins, herbs, eye drops, creams, and over-the-counter medicines. Any blood disorders you have. Any surgeries you have had. Any medical conditions you have. Whether you are pregnant or may be pregnant. What are the risks? Generally, this is a safe test. However, problems may occur, including an allergic reaction to dye (contrast) that may be used during the test. What happens before the test? No specific preparation is needed. You may eat and drink normally. What happens during the test? You will take  off your clothes from the waist up and put on a hospital gown. Electrodes or electrocardiogram (ECG)patches may be placed on your chest. The electrodes or patches are then connected to a device that monitors your heart rate and rhythm. You will lie down on a table for an  ultrasound exam. A gel will be applied to your chest to help sound waves pass through your skin. A handheld device, called a transducer, will be pressed against your chest and moved over your heart. The transducer produces sound waves that travel to your heart and bounce back (or "echo" back) to the transducer. These sound waves will be captured in real-time and changed into images of your heart that can be viewed on a video monitor. The images will be recorded on a computer and reviewed by your health care provider. You may be asked to change positions or hold your breath for a short time. This makes it easier to get different views or better views of your heart. In some cases, you may receive contrast through an IV in one of your veins. This can improve the quality of the pictures from your heart. The procedure may vary among health care providers and hospitals.   What can I expect after the test? You may return to your normal, everyday life, including diet, activities, and medicines, unless your health care provider tells you not to do that. Follow these instructions at home: It is up to you to get the results of your test. Ask your health care provider, or the department that is doing the test, when your results will be ready. Keep all follow-up visits. This is important. Summary An echocardiogram is a test that uses sound waves (ultrasound) to produce images of the heart. Images from an echocardiogram can provide important information about the size and shape of your heart, heart muscle function, heart valve function, and other possible heart problems. You do not need to do anything to prepare before this test. You may eat and drink normally. After the echocardiogram is completed, you may return to your normal, everyday life, unless your health care provider tells you not to do that. This information is not intended to replace advice given to you by your health care provider. Make sure you  discuss any questions you have with your health care provider. Document Revised: 02/27/2020 Document Reviewed: 02/27/2020 Elsevier Patient Education  2021 Elsevier Inc.   Important Information About Sugar

## 2023-03-18 DIAGNOSIS — E785 Hyperlipidemia, unspecified: Secondary | ICD-10-CM | POA: Diagnosis not present

## 2023-03-18 DIAGNOSIS — E46 Unspecified protein-calorie malnutrition: Secondary | ICD-10-CM | POA: Diagnosis not present

## 2023-03-18 DIAGNOSIS — G894 Chronic pain syndrome: Secondary | ICD-10-CM | POA: Diagnosis not present

## 2023-03-18 DIAGNOSIS — D72829 Elevated white blood cell count, unspecified: Secondary | ICD-10-CM | POA: Diagnosis not present

## 2023-03-18 DIAGNOSIS — R739 Hyperglycemia, unspecified: Secondary | ICD-10-CM | POA: Diagnosis not present

## 2023-03-18 DIAGNOSIS — Z139 Encounter for screening, unspecified: Secondary | ICD-10-CM | POA: Diagnosis not present

## 2023-03-18 DIAGNOSIS — M8008XA Age-related osteoporosis with current pathological fracture, vertebra(e), initial encounter for fracture: Secondary | ICD-10-CM | POA: Diagnosis not present

## 2023-03-18 DIAGNOSIS — J449 Chronic obstructive pulmonary disease, unspecified: Secondary | ICD-10-CM | POA: Diagnosis not present

## 2023-03-18 DIAGNOSIS — D692 Other nonthrombocytopenic purpura: Secondary | ICD-10-CM | POA: Diagnosis not present

## 2023-03-18 DIAGNOSIS — R000 Tachycardia, unspecified: Secondary | ICD-10-CM | POA: Diagnosis not present

## 2023-03-19 DIAGNOSIS — J449 Chronic obstructive pulmonary disease, unspecified: Secondary | ICD-10-CM | POA: Diagnosis not present

## 2023-04-08 DIAGNOSIS — Z9981 Dependence on supplemental oxygen: Secondary | ICD-10-CM | POA: Diagnosis not present

## 2023-04-08 DIAGNOSIS — J441 Chronic obstructive pulmonary disease with (acute) exacerbation: Secondary | ICD-10-CM | POA: Diagnosis not present

## 2023-04-15 DIAGNOSIS — R634 Abnormal weight loss: Secondary | ICD-10-CM | POA: Diagnosis not present

## 2023-04-15 DIAGNOSIS — J449 Chronic obstructive pulmonary disease, unspecified: Secondary | ICD-10-CM | POA: Diagnosis not present

## 2023-04-15 DIAGNOSIS — R911 Solitary pulmonary nodule: Secondary | ICD-10-CM | POA: Diagnosis not present

## 2023-04-15 DIAGNOSIS — I2510 Atherosclerotic heart disease of native coronary artery without angina pectoris: Secondary | ICD-10-CM | POA: Diagnosis not present

## 2023-04-19 DIAGNOSIS — J449 Chronic obstructive pulmonary disease, unspecified: Secondary | ICD-10-CM | POA: Diagnosis not present

## 2023-04-23 DIAGNOSIS — Z23 Encounter for immunization: Secondary | ICD-10-CM | POA: Diagnosis not present

## 2023-04-23 DIAGNOSIS — M8008XA Age-related osteoporosis with current pathological fracture, vertebra(e), initial encounter for fracture: Secondary | ICD-10-CM | POA: Diagnosis not present

## 2023-04-23 DIAGNOSIS — G894 Chronic pain syndrome: Secondary | ICD-10-CM | POA: Diagnosis not present

## 2023-05-11 DIAGNOSIS — E041 Nontoxic single thyroid nodule: Secondary | ICD-10-CM | POA: Diagnosis not present

## 2023-05-11 DIAGNOSIS — R918 Other nonspecific abnormal finding of lung field: Secondary | ICD-10-CM | POA: Diagnosis not present

## 2023-05-11 DIAGNOSIS — I700 Atherosclerosis of aorta: Secondary | ICD-10-CM | POA: Diagnosis not present

## 2023-05-11 DIAGNOSIS — R911 Solitary pulmonary nodule: Secondary | ICD-10-CM | POA: Diagnosis not present

## 2023-05-11 DIAGNOSIS — J439 Emphysema, unspecified: Secondary | ICD-10-CM | POA: Diagnosis not present

## 2023-05-17 DIAGNOSIS — E785 Hyperlipidemia, unspecified: Secondary | ICD-10-CM | POA: Diagnosis not present

## 2023-05-17 DIAGNOSIS — J45998 Other asthma: Secondary | ICD-10-CM | POA: Diagnosis not present

## 2023-05-17 DIAGNOSIS — J449 Chronic obstructive pulmonary disease, unspecified: Secondary | ICD-10-CM | POA: Diagnosis not present

## 2023-05-17 DIAGNOSIS — I10 Essential (primary) hypertension: Secondary | ICD-10-CM | POA: Diagnosis not present

## 2023-05-19 DIAGNOSIS — J449 Chronic obstructive pulmonary disease, unspecified: Secondary | ICD-10-CM | POA: Diagnosis not present

## 2023-06-19 DIAGNOSIS — J449 Chronic obstructive pulmonary disease, unspecified: Secondary | ICD-10-CM | POA: Diagnosis not present

## 2023-06-29 DIAGNOSIS — Z79891 Long term (current) use of opiate analgesic: Secondary | ICD-10-CM | POA: Diagnosis not present

## 2023-06-29 DIAGNOSIS — J441 Chronic obstructive pulmonary disease with (acute) exacerbation: Secondary | ICD-10-CM | POA: Diagnosis not present

## 2023-06-29 DIAGNOSIS — M8008XA Age-related osteoporosis with current pathological fracture, vertebra(e), initial encounter for fracture: Secondary | ICD-10-CM | POA: Diagnosis not present

## 2023-06-29 DIAGNOSIS — Z681 Body mass index (BMI) 19 or less, adult: Secondary | ICD-10-CM | POA: Diagnosis not present

## 2023-06-29 DIAGNOSIS — E46 Unspecified protein-calorie malnutrition: Secondary | ICD-10-CM | POA: Diagnosis not present

## 2023-06-29 DIAGNOSIS — G894 Chronic pain syndrome: Secondary | ICD-10-CM | POA: Diagnosis not present

## 2023-06-29 DIAGNOSIS — J9611 Chronic respiratory failure with hypoxia: Secondary | ICD-10-CM | POA: Diagnosis not present

## 2023-06-29 DIAGNOSIS — I10 Essential (primary) hypertension: Secondary | ICD-10-CM | POA: Diagnosis not present

## 2023-06-29 DIAGNOSIS — Z139 Encounter for screening, unspecified: Secondary | ICD-10-CM | POA: Diagnosis not present

## 2023-06-29 DIAGNOSIS — D72829 Elevated white blood cell count, unspecified: Secondary | ICD-10-CM | POA: Diagnosis not present

## 2023-06-29 DIAGNOSIS — R739 Hyperglycemia, unspecified: Secondary | ICD-10-CM | POA: Diagnosis not present

## 2023-06-29 DIAGNOSIS — E785 Hyperlipidemia, unspecified: Secondary | ICD-10-CM | POA: Diagnosis not present

## 2023-07-01 DIAGNOSIS — R634 Abnormal weight loss: Secondary | ICD-10-CM | POA: Diagnosis not present

## 2023-07-01 DIAGNOSIS — J449 Chronic obstructive pulmonary disease, unspecified: Secondary | ICD-10-CM | POA: Diagnosis not present

## 2023-07-01 DIAGNOSIS — I2510 Atherosclerotic heart disease of native coronary artery without angina pectoris: Secondary | ICD-10-CM | POA: Diagnosis not present

## 2023-07-19 DIAGNOSIS — J449 Chronic obstructive pulmonary disease, unspecified: Secondary | ICD-10-CM | POA: Diagnosis not present

## 2023-08-05 DIAGNOSIS — I2510 Atherosclerotic heart disease of native coronary artery without angina pectoris: Secondary | ICD-10-CM | POA: Diagnosis not present

## 2023-08-05 DIAGNOSIS — J449 Chronic obstructive pulmonary disease, unspecified: Secondary | ICD-10-CM | POA: Diagnosis not present

## 2023-08-05 DIAGNOSIS — R634 Abnormal weight loss: Secondary | ICD-10-CM | POA: Diagnosis not present

## 2023-08-19 DIAGNOSIS — J449 Chronic obstructive pulmonary disease, unspecified: Secondary | ICD-10-CM | POA: Diagnosis not present

## 2023-08-30 DIAGNOSIS — J449 Chronic obstructive pulmonary disease, unspecified: Secondary | ICD-10-CM | POA: Diagnosis not present

## 2023-08-30 DIAGNOSIS — Z681 Body mass index (BMI) 19 or less, adult: Secondary | ICD-10-CM | POA: Diagnosis not present

## 2023-08-30 DIAGNOSIS — G894 Chronic pain syndrome: Secondary | ICD-10-CM | POA: Diagnosis not present

## 2023-09-17 DIAGNOSIS — J449 Chronic obstructive pulmonary disease, unspecified: Secondary | ICD-10-CM | POA: Diagnosis not present

## 2023-09-27 DIAGNOSIS — I2510 Atherosclerotic heart disease of native coronary artery without angina pectoris: Secondary | ICD-10-CM | POA: Diagnosis not present

## 2023-09-27 DIAGNOSIS — J449 Chronic obstructive pulmonary disease, unspecified: Secondary | ICD-10-CM | POA: Diagnosis not present

## 2023-09-27 DIAGNOSIS — R634 Abnormal weight loss: Secondary | ICD-10-CM | POA: Diagnosis not present

## 2023-10-04 DIAGNOSIS — J449 Chronic obstructive pulmonary disease, unspecified: Secondary | ICD-10-CM | POA: Diagnosis not present

## 2023-10-13 DIAGNOSIS — J449 Chronic obstructive pulmonary disease, unspecified: Secondary | ICD-10-CM | POA: Diagnosis not present

## 2023-10-17 DIAGNOSIS — J449 Chronic obstructive pulmonary disease, unspecified: Secondary | ICD-10-CM | POA: Diagnosis not present

## 2023-10-20 DIAGNOSIS — J449 Chronic obstructive pulmonary disease, unspecified: Secondary | ICD-10-CM | POA: Diagnosis not present

## 2023-10-27 DIAGNOSIS — J449 Chronic obstructive pulmonary disease, unspecified: Secondary | ICD-10-CM | POA: Diagnosis not present

## 2023-10-28 DIAGNOSIS — J449 Chronic obstructive pulmonary disease, unspecified: Secondary | ICD-10-CM | POA: Diagnosis not present

## 2023-11-02 DIAGNOSIS — J449 Chronic obstructive pulmonary disease, unspecified: Secondary | ICD-10-CM | POA: Diagnosis not present

## 2023-11-02 DIAGNOSIS — Z681 Body mass index (BMI) 19 or less, adult: Secondary | ICD-10-CM | POA: Diagnosis not present

## 2023-11-02 DIAGNOSIS — E46 Unspecified protein-calorie malnutrition: Secondary | ICD-10-CM | POA: Diagnosis not present

## 2023-11-02 DIAGNOSIS — Z139 Encounter for screening, unspecified: Secondary | ICD-10-CM | POA: Diagnosis not present

## 2023-11-02 DIAGNOSIS — R739 Hyperglycemia, unspecified: Secondary | ICD-10-CM | POA: Diagnosis not present

## 2023-11-02 DIAGNOSIS — G894 Chronic pain syndrome: Secondary | ICD-10-CM | POA: Diagnosis not present

## 2023-11-02 DIAGNOSIS — E785 Hyperlipidemia, unspecified: Secondary | ICD-10-CM | POA: Diagnosis not present

## 2023-11-02 DIAGNOSIS — M8008XA Age-related osteoporosis with current pathological fracture, vertebra(e), initial encounter for fracture: Secondary | ICD-10-CM | POA: Diagnosis not present

## 2023-11-02 DIAGNOSIS — D72829 Elevated white blood cell count, unspecified: Secondary | ICD-10-CM | POA: Diagnosis not present

## 2023-11-05 DIAGNOSIS — I10 Essential (primary) hypertension: Secondary | ICD-10-CM | POA: Diagnosis not present

## 2023-11-05 DIAGNOSIS — J449 Chronic obstructive pulmonary disease, unspecified: Secondary | ICD-10-CM | POA: Diagnosis not present

## 2023-11-05 DIAGNOSIS — R059 Cough, unspecified: Secondary | ICD-10-CM | POA: Diagnosis not present

## 2023-11-10 DIAGNOSIS — J449 Chronic obstructive pulmonary disease, unspecified: Secondary | ICD-10-CM | POA: Diagnosis not present

## 2023-11-17 DIAGNOSIS — I10 Essential (primary) hypertension: Secondary | ICD-10-CM | POA: Diagnosis not present

## 2023-11-17 DIAGNOSIS — G894 Chronic pain syndrome: Secondary | ICD-10-CM | POA: Diagnosis not present

## 2023-11-17 DIAGNOSIS — J449 Chronic obstructive pulmonary disease, unspecified: Secondary | ICD-10-CM | POA: Diagnosis not present

## 2023-11-19 DIAGNOSIS — M8008XA Age-related osteoporosis with current pathological fracture, vertebra(e), initial encounter for fracture: Secondary | ICD-10-CM | POA: Diagnosis not present

## 2023-11-24 DIAGNOSIS — J449 Chronic obstructive pulmonary disease, unspecified: Secondary | ICD-10-CM | POA: Diagnosis not present

## 2023-11-26 DIAGNOSIS — J449 Chronic obstructive pulmonary disease, unspecified: Secondary | ICD-10-CM | POA: Diagnosis not present

## 2023-11-29 DIAGNOSIS — J449 Chronic obstructive pulmonary disease, unspecified: Secondary | ICD-10-CM | POA: Diagnosis not present

## 2023-12-06 DIAGNOSIS — J449 Chronic obstructive pulmonary disease, unspecified: Secondary | ICD-10-CM | POA: Diagnosis not present

## 2023-12-08 DIAGNOSIS — J069 Acute upper respiratory infection, unspecified: Secondary | ICD-10-CM | POA: Diagnosis not present

## 2023-12-08 DIAGNOSIS — R059 Cough, unspecified: Secondary | ICD-10-CM | POA: Diagnosis not present

## 2023-12-08 DIAGNOSIS — J449 Chronic obstructive pulmonary disease, unspecified: Secondary | ICD-10-CM | POA: Diagnosis not present

## 2023-12-17 DIAGNOSIS — J449 Chronic obstructive pulmonary disease, unspecified: Secondary | ICD-10-CM | POA: Diagnosis not present

## 2023-12-18 DIAGNOSIS — G894 Chronic pain syndrome: Secondary | ICD-10-CM | POA: Diagnosis not present

## 2023-12-18 DIAGNOSIS — J449 Chronic obstructive pulmonary disease, unspecified: Secondary | ICD-10-CM | POA: Diagnosis not present

## 2023-12-18 DIAGNOSIS — I10 Essential (primary) hypertension: Secondary | ICD-10-CM | POA: Diagnosis not present

## 2024-01-04 DIAGNOSIS — G894 Chronic pain syndrome: Secondary | ICD-10-CM | POA: Diagnosis not present

## 2024-01-04 DIAGNOSIS — J449 Chronic obstructive pulmonary disease, unspecified: Secondary | ICD-10-CM | POA: Diagnosis not present

## 2024-01-04 DIAGNOSIS — Z681 Body mass index (BMI) 19 or less, adult: Secondary | ICD-10-CM | POA: Diagnosis not present

## 2024-01-04 DIAGNOSIS — Z79891 Long term (current) use of opiate analgesic: Secondary | ICD-10-CM | POA: Diagnosis not present

## 2024-01-17 DIAGNOSIS — G894 Chronic pain syndrome: Secondary | ICD-10-CM | POA: Diagnosis not present

## 2024-01-17 DIAGNOSIS — E785 Hyperlipidemia, unspecified: Secondary | ICD-10-CM | POA: Diagnosis not present

## 2024-01-17 DIAGNOSIS — J449 Chronic obstructive pulmonary disease, unspecified: Secondary | ICD-10-CM | POA: Diagnosis not present

## 2024-01-17 DIAGNOSIS — I10 Essential (primary) hypertension: Secondary | ICD-10-CM | POA: Diagnosis not present

## 2024-02-10 DIAGNOSIS — J449 Chronic obstructive pulmonary disease, unspecified: Secondary | ICD-10-CM | POA: Diagnosis not present

## 2024-02-10 DIAGNOSIS — I2510 Atherosclerotic heart disease of native coronary artery without angina pectoris: Secondary | ICD-10-CM | POA: Diagnosis not present

## 2024-02-10 DIAGNOSIS — R634 Abnormal weight loss: Secondary | ICD-10-CM | POA: Diagnosis not present

## 2024-02-16 DIAGNOSIS — J449 Chronic obstructive pulmonary disease, unspecified: Secondary | ICD-10-CM | POA: Diagnosis not present

## 2024-02-17 DIAGNOSIS — E785 Hyperlipidemia, unspecified: Secondary | ICD-10-CM | POA: Diagnosis not present

## 2024-02-17 DIAGNOSIS — J449 Chronic obstructive pulmonary disease, unspecified: Secondary | ICD-10-CM | POA: Diagnosis not present

## 2024-02-17 DIAGNOSIS — G894 Chronic pain syndrome: Secondary | ICD-10-CM | POA: Diagnosis not present

## 2024-02-17 DIAGNOSIS — I10 Essential (primary) hypertension: Secondary | ICD-10-CM | POA: Diagnosis not present

## 2024-02-21 ENCOUNTER — Ambulatory Visit

## 2024-02-21 DIAGNOSIS — I340 Nonrheumatic mitral (valve) insufficiency: Secondary | ICD-10-CM

## 2024-02-21 DIAGNOSIS — I071 Rheumatic tricuspid insufficiency: Secondary | ICD-10-CM | POA: Diagnosis not present

## 2024-02-21 DIAGNOSIS — I272 Pulmonary hypertension, unspecified: Secondary | ICD-10-CM

## 2024-02-21 LAB — ECHOCARDIOGRAM COMPLETE
AR max vel: 1.77 cm2
AV Area VTI: 1.8 cm2
AV Area mean vel: 1.75 cm2
AV Mean grad: 3 mmHg
AV Peak grad: 5.9 mmHg
Ao pk vel: 1.22 m/s
Area-P 1/2: 3.6 cm2
MV M vel: 2.64 m/s
MV Peak grad: 27.9 mmHg
MV VTI: 1.05 cm2
S' Lateral: 3.1 cm

## 2024-02-22 ENCOUNTER — Ambulatory Visit: Payer: Self-pay

## 2024-02-22 NOTE — Progress Notes (Signed)
 Please inform her the results from the echocardiogram study show normal pumping function and mild increase stiffness of the heart muscle. Mild amount of leakage associated with mitral valve observed.  This is less in comparison to your prior echocardiogram from 2024. Overall reassuring results. Thank you

## 2024-02-23 NOTE — Telephone Encounter (Signed)
-----   Message from [place]ond[place] R Madireddy sent at 02/22/2024 11:41 AM EDT ----- Please inform her the results from the echocardiogram study show normal pumping function and mild increase stiffness of the heart muscle. Mild amount of leakage associated with mitral valve observed.  This is less in comparison to your prior echocardiogram from 2024. Overall reassuring results. Thank you ----- Message ----- From: Interface, [employer] Sent: 02/21/2024  12:29 PM EDT To: Victoria JONELLE Kobus, MD

## 2024-02-23 NOTE — Telephone Encounter (Signed)
 Left vm to return call.

## 2024-03-07 DIAGNOSIS — J449 Chronic obstructive pulmonary disease, unspecified: Secondary | ICD-10-CM | POA: Diagnosis not present

## 2024-03-07 DIAGNOSIS — G894 Chronic pain syndrome: Secondary | ICD-10-CM | POA: Diagnosis not present

## 2024-03-07 DIAGNOSIS — Z681 Body mass index (BMI) 19 or less, adult: Secondary | ICD-10-CM | POA: Diagnosis not present

## 2024-03-07 DIAGNOSIS — G4700 Insomnia, unspecified: Secondary | ICD-10-CM | POA: Diagnosis not present

## 2024-03-08 DIAGNOSIS — R002 Palpitations: Secondary | ICD-10-CM | POA: Insufficient documentation

## 2024-03-08 DIAGNOSIS — Z720 Tobacco use: Secondary | ICD-10-CM | POA: Insufficient documentation

## 2024-03-08 DIAGNOSIS — M1990 Unspecified osteoarthritis, unspecified site: Secondary | ICD-10-CM | POA: Insufficient documentation

## 2024-03-08 DIAGNOSIS — F419 Anxiety disorder, unspecified: Secondary | ICD-10-CM | POA: Insufficient documentation

## 2024-03-08 DIAGNOSIS — E781 Pure hyperglyceridemia: Secondary | ICD-10-CM | POA: Insufficient documentation

## 2024-03-08 DIAGNOSIS — K635 Polyp of colon: Secondary | ICD-10-CM | POA: Insufficient documentation

## 2024-03-08 DIAGNOSIS — I10 Essential (primary) hypertension: Secondary | ICD-10-CM | POA: Insufficient documentation

## 2024-03-08 DIAGNOSIS — M810 Age-related osteoporosis without current pathological fracture: Secondary | ICD-10-CM | POA: Insufficient documentation

## 2024-03-09 ENCOUNTER — Ambulatory Visit

## 2024-03-18 DIAGNOSIS — J449 Chronic obstructive pulmonary disease, unspecified: Secondary | ICD-10-CM | POA: Diagnosis not present

## 2024-03-19 DIAGNOSIS — I10 Essential (primary) hypertension: Secondary | ICD-10-CM | POA: Diagnosis not present

## 2024-03-19 DIAGNOSIS — E785 Hyperlipidemia, unspecified: Secondary | ICD-10-CM | POA: Diagnosis not present

## 2024-03-19 DIAGNOSIS — J449 Chronic obstructive pulmonary disease, unspecified: Secondary | ICD-10-CM | POA: Diagnosis not present

## 2024-03-27 DIAGNOSIS — E785 Hyperlipidemia, unspecified: Secondary | ICD-10-CM | POA: Insufficient documentation

## 2024-03-27 DIAGNOSIS — J449 Chronic obstructive pulmonary disease, unspecified: Secondary | ICD-10-CM | POA: Insufficient documentation

## 2024-03-27 DIAGNOSIS — F32A Depression, unspecified: Secondary | ICD-10-CM | POA: Insufficient documentation

## 2024-03-28 ENCOUNTER — Ambulatory Visit

## 2024-03-28 VITALS — BP 90/60 | HR 109 | Ht 62.0 in | Wt 96.4 lb

## 2024-03-28 DIAGNOSIS — E782 Mixed hyperlipidemia: Secondary | ICD-10-CM | POA: Diagnosis not present

## 2024-03-28 DIAGNOSIS — I340 Nonrheumatic mitral (valve) insufficiency: Secondary | ICD-10-CM | POA: Diagnosis not present

## 2024-03-28 DIAGNOSIS — I361 Nonrheumatic tricuspid (valve) insufficiency: Secondary | ICD-10-CM

## 2024-03-28 DIAGNOSIS — I10 Essential (primary) hypertension: Secondary | ICD-10-CM

## 2024-03-28 DIAGNOSIS — I2510 Atherosclerotic heart disease of native coronary artery without angina pectoris: Secondary | ICD-10-CM

## 2024-03-28 MED ORDER — ATORVASTATIN CALCIUM 80 MG PO TABS: 80.0000 mg | ORAL_TABLET | Freq: Every day | ORAL | 3 refills | Status: AC

## 2024-03-28 NOTE — Assessment & Plan Note (Signed)
 Not at goal. Lipid panel from 11/02/2023 LDL 105, HDL 64, total cholesterol 810 and triglycerides 114. Titrate up atorvastatin  dose to 80 mg once daily. She mentions that she has 180 tablets of 40 mg atorvastatin  which she is going to start doubling up until she exhausts the supply and then gets a refill.

## 2024-03-28 NOTE — Patient Instructions (Signed)
 Medication Instructions:  Your physician has recommended you make the following change in your medication:   START: Atorvastatin  80 mg daily  *If you need a refill on your cardiac medications before your next appointment, please call your pharmacy*  Lab Work: None If you have labs (blood work) drawn today and your tests are completely normal, you will receive your results only by: MyChart Message (if you have MyChart) OR A paper copy in the mail If you have any lab test that is abnormal or we need to change your treatment, we will call you to review the results.  Testing/Procedures: None  Follow-Up: At [facility], you and your health needs are our priority.  As part of our continuing mission to provide you with exceptional heart care, our providers are all part of one team.  This team includes your primary Cardiologist (physician) and Advanced Practice Providers or APPs (Physician Assistants and Nurse Practitioners) who all work together to provide you with the care you need, when you need it.  Your next appointment:   1 year(s)  Provider:   Alean Kobus, MD    We recommend signing up for the patient portal called MyChart.  Sign up information is provided on this After Visit Summary.  MyChart is used to connect with patients for Virtual Visits (Telemedicine).  Patients are able to view lab/test results, encounter notes, upcoming appointments, etc.  Non-urgent messages can be sent to your provider as well.   To learn more about what you can do with MyChart, go to [email or web address].   Other Instructions None

## 2024-03-28 NOTE — Assessment & Plan Note (Signed)
 Echocardiogram from 02/21/2024 notes mild MR. Reassured her about the findings

## 2024-03-28 NOTE — Assessment & Plan Note (Signed)
 No change in symptoms. Continue with aspirin 81 mg once daily. Continue with atorvastatin , dose being titrated up for lipid optimization.

## 2024-03-28 NOTE — Assessment & Plan Note (Signed)
 Well-controlled. In fact soft blood pressures today.  Asymptomatic. Continues on low-dose metoprolol succinate 50 mg once daily.

## 2024-03-28 NOTE — Progress Notes (Signed)
 Cardiology Consultation:    Date:  03/28/2024   ID:  Victoria Gibson, DOB 02-07-1951, MRN [id number]  PCP:  Erick Greig LABOR, NP  Cardiologist:  Alean JONELLE Kobus, MD   Referring MD: Alec House, MD   No chief complaint on file.    ASSESSMENT AND PLAN:   [name] history of CAD prior PCI in 2001, COPD [severe and on nocturnal O2], dyslipidemia, mild to moderate MR, moderate to severe TR, mild pulmonary hypertension on prior echocardiograms but recent echocardiogram from August 2025 showed no major valve abnormality.  Tobacco smoking [quit about June last year but continues to smoke intermittently and last cigarette was about 3 months ago.  Also has pulmonary nodules noted on prior CT chest and continues to follow-up with pulmonologist.   Echocardiogram 02/21/2024 with LVEF 60 to 65%, grade 1 diastolic dysfunction, normal RV size and function and mild MR Problem List Items Addressed This Visit     Mild mitral regurgitation   Echocardiogram from 02/21/2024 notes mild MR. Reassured her about the findings      Moderate to severe tricuspid regurgitation, by TTE June 2024   TR not significant on recent echocardiogram from August 2025.  However noted to be moderate to severe in prior echocardiograms in the setting of severe COPD.  Continue with low-salt diet. Continue with appropriate COPD management as per pulmonologist.       CAD (coronary artery disease) - Primary   No change in symptoms. Continue with aspirin 81 mg once daily. Continue with atorvastatin , dose being titrated up for lipid optimization.      Relevant Orders   EKG 12-Lead (Completed)   Hypertension   Well-controlled. In fact soft blood pressures today.  Asymptomatic. Continues on low-dose metoprolol succinate 50 mg once daily.       Hyperlipidemia   Not at goal. Lipid panel from 11/02/2023 LDL 105, HDL 64, total cholesterol 810 and triglycerides 114. Titrate up atorvastatin  dose to 80 mg  once daily. She mentions that she has 180 tablets of 40 mg atorvastatin  which she is going to start doubling up until she exhausts the supply and then gets a refill.      Return to clinic tentatively in 1 year.  Earlier follow-up on an as-needed basis.   History of Present Illness:    Victoria Gibson is a 73 y.o. female who is being seen today for follow-up visit. PCP is Alec House, MD. Last visit with me in clinic was 03/01/2023. Follows up with pulmonologist regarding pulmonary nodules and COPD.  Here for the visit by herself.  Pleasant woman with history of CAD prior PCI in 2001, COPD [severe and on nocturnal O2], dyslipidemia, mild to moderate MR, moderate to severe TR, mild pulmonary hypertension on prior echocardiograms but recent echocardiogram from August 2025 showed no major valve abnormality.  Tobacco smoking [quit about June last year but continues to smoke intermittently and last cigarette was about 3 months ago.  Also has pulmonary nodules noted on prior CT chest and continues to follow-up with pulmonologist.  EKG in the clinic today shows sinus rhythm 100 109/min, PR interval normal 130 ms, QRS duration 100 ms, anteroseptal Q waves.  No other significant ischemic changes.  Echocardiogram completed 02/21/2024 noted LVEF 60 to 65%, grade 1 diastolic dysfunction, RV normal size and function, mild MR  Blood work from [facility] dated 11/02/2023 notes hemoglobin 13, hematocrit 39.5, platelets 368, WBC 10.4 BUN 14, creatinine 0.79, sodium 143 and potassium 4.4.  Normal transaminases and alkaline phosphatase. Lipid panel with total cholesterol 189, triglycerides 114, HDL 64, LDL 105. Hemoglobin A1c 5.6.  Mentions overall doing well.  Uses O2 only at night.  Does carry ambulatory oxygen  but uses it only if needed.  Denies any chest pain, palpitation, orthopnea or paroxysmal nocturnal dyspnea.  No pedal edema.  No syncopal or near syncopal episodes. No blood in urine or stools. Good  compliance with her medications.   Past Medical History:  Diagnosis Date   Anxiety and depression    CAD (coronary artery disease)    Dr. Ladona   Cigarette smoker 12/25/2014   Colon polyps    COPD (chronic obstructive pulmonary disease) (HCC)    COPD GOLDIV  12/25/2014   Followed in Pulmonary clinic/ [facility]/ [facility]  - 12/24/2014 p extensive coaching HFA effectiveness =    75% > symbicort  160 2bid/ atroven qid maint rx   - PFTs  02/04/2015    FEV1  0.97 (39%) ratio 49 no better p saba after am symbicort   and dlco 36% corrects to 50 for alv vol  - tudorza added 02/04/15      Depression    Hyperglyceridemia    Hyperlipidemia    Hypertension    Mild pulmonary hypertension, estimated RVSP 38 mmHg by TTE; 03/01/2023   Mild to moderate mitral regurgitation by echocardiogram June 2024 03/01/2023   Moderate to severe tricuspid regurgitation, by TTE June 2024 03/01/2023   Osteoarthritis    Osteoporosis    Palpitations    Shingles 09/18/2007   Tobacco abuse    Tubular adenoma 07/21/1999   colonoscopy    Past Surgical History:  Procedure Laterality Date   BREAST BIOPSY     CORONARY STENT PLACEMENT     MASTECTOMY, PARTIAL     TONSILLECTOMY     VAGINAL HYSTERECTOMY      Current Medications: Current Meds  Medication Sig   albuterol  (PROVENTIL  HFA;VENTOLIN  HFA) 108 (90 BASE) MCG/ACT inhaler Inhale 1-2 puffs into the lungs every 6 (six) hours as needed for wheezing or shortness of breath.   aspirin EC 81 MG tablet Take 81 mg by mouth daily. Swallow whole.   atorvastatin  (LIPITOR) 40 MG tablet Take 40 mg by mouth daily.   BREZTRI AEROSPHERE 160-9-4.8 MCG/ACT AERO Inhale 2 puffs into the lungs 2 (two) times daily.   busPIRone (BUSPAR) 5 MG tablet Take 5 mg by mouth 3 (three) times daily.   HYDROcodone-acetaminophen (NORCO) 7.5-325 MG tablet Take 1 tablet by mouth every 6 (six) hours as needed for moderate pain or severe pain.   ipratropium (ATROVENT HFA) 17 MCG/ACT inhaler Inhale 2  puffs into the lungs every 4 (four) hours as needed for wheezing.   metoprolol succinate (TOPROL-XL) 50 MG 24 hr tablet Take 50 mg by mouth daily. Take with or immediately following a meal.   PROLIA  60 MG/ML SOSY injection Inject 60 mg into the skin every 6 (six) months.   roflumilast (DALIRESP) 500 MCG TABS tablet Take 500 mcg by mouth daily.   traZODone (DESYREL) 50 MG tablet Take 25-50 mg by mouth at bedtime.     Allergies:   Niacin and related   Social History   Socioeconomic History   Marital status: Single    Spouse name: Not on file   Number of children: Y   Years of education: Not on file   Highest education level: Not on file  Occupational History   Occupation: CNA  Tobacco Use   Smoking status: Former  Current packs/day: 1.50    Average packs/day: 1.5 packs/day for 44.0 years (66.0 ttl pk-yrs)    Types: Cigarettes   Smokeless tobacco: Never  Vaping Use   Vaping status: Never Used  Substance and Sexual Activity   Alcohol use: No    Alcohol/week: 0.0 standard drinks of alcohol   Drug use: No   Sexual activity: Not on file  Other Topics Concern   Not on file  Social History Narrative   Not on file   Social Drivers of Health   Financial Resource Strain: Not on file  Food Insecurity: Not on file  Transportation Needs: Not on file  Physical Activity: Not on file  Stress: Not on file  Social Connections: Not on file     Family History: The patient's family history includes Cancer in her brother; Cervical cancer in her daughter; Colon cancer in her mother; Diabetes in her father; Emphysema in her maternal grandmother; Heart attack in her brother. ROS:   Please see the history of present illness.    All 14 point review of systems negative except as described per history of present illness.  EKGs/Labs/Other Studies Reviewed:    The following studies were reviewed today:   EKG:  EKG Interpretation Date/Time:  Tuesday March 28 2024 09:29:45  EDT Ventricular Rate:  109 PR Interval:  130 QRS Duration:  100 QT Interval:  366 QTC Calculation: 492 R Axis:   32  Text Interpretation: Sinus tachycardia Low voltage QRS Septal infarct , age undetermined When compared with ECG of 01-Mar-2023 13:32, PR interval has increased QRS duration has increased Septal infarct is now Present QT has lengthened Confirmed by Liborio Hai reddy [id number]) on 03/28/2024 10:09:53 AM    Recent Labs: No results found for requested labs within last 365 days.  Recent Lipid Panel No results found for: CHOL, TRIG, HDL, CHOLHDL, VLDL, LDLCALC, LDLDIRECT  Physical Exam:    VS:  BP 90/60   Pulse (!) 109   Ht 5' 2 (1.575 m)   Wt 96 lb 6.4 oz (43.7 kg)   SpO2 99%   BMI 17.63 kg/m     Wt Readings from Last 3 Encounters:  03/28/24 96 lb 6.4 oz (43.7 kg)  03/01/23 94 lb (42.6 kg)  02/04/23 95 lb (43.1 kg)     GENERAL:  Well nourished, well developed in no acute distress NECK: No JVD; No carotid bruits CARDIAC: RRR, S1 and S2 present, no murmurs, no rubs, no gallops CHEST:  Clear to auscultation without rales, wheezing or rhonchi  Extremities: No pitting pedal edema. Pulses bilaterally symmetric with radial 2+ and dorsalis pedis 2+ NEUROLOGIC:  Alert and oriented x 3  Medication Adjustments/Labs and Tests Ordered: Current medicines are reviewed at length with the patient today.  Concerns regarding medicines are outlined above.  Orders Placed This Encounter  Procedures   EKG 12-Lead   No orders of the defined types were placed in this encounter.   Signed, Hai jess Liborio, MD, MPH, [facility]. 03/28/2024 10:26 AM    [facility]

## 2024-03-28 NOTE — Assessment & Plan Note (Signed)
 TR not significant on recent echocardiogram from August 2025.  However noted to be moderate to severe in prior echocardiograms in the setting of severe COPD.  Continue with low-salt diet. Continue with appropriate COPD management as per pulmonologist.

## 2024-04-06 DIAGNOSIS — R634 Abnormal weight loss: Secondary | ICD-10-CM | POA: Diagnosis not present

## 2024-04-06 DIAGNOSIS — I2510 Atherosclerotic heart disease of native coronary artery without angina pectoris: Secondary | ICD-10-CM | POA: Diagnosis not present

## 2024-04-06 DIAGNOSIS — J449 Chronic obstructive pulmonary disease, unspecified: Secondary | ICD-10-CM | POA: Diagnosis not present

## 2024-04-06 DIAGNOSIS — J9611 Chronic respiratory failure with hypoxia: Secondary | ICD-10-CM | POA: Diagnosis not present

## 2024-04-18 DIAGNOSIS — J449 Chronic obstructive pulmonary disease, unspecified: Secondary | ICD-10-CM | POA: Diagnosis not present

## 2024-04-18 DIAGNOSIS — G894 Chronic pain syndrome: Secondary | ICD-10-CM | POA: Diagnosis not present

## 2024-04-18 DIAGNOSIS — E785 Hyperlipidemia, unspecified: Secondary | ICD-10-CM | POA: Diagnosis not present

## 2024-04-18 DIAGNOSIS — I10 Essential (primary) hypertension: Secondary | ICD-10-CM | POA: Diagnosis not present

## 2024-05-08 DIAGNOSIS — G894 Chronic pain syndrome: Secondary | ICD-10-CM | POA: Diagnosis not present

## 2024-05-08 DIAGNOSIS — H539 Unspecified visual disturbance: Secondary | ICD-10-CM | POA: Diagnosis not present

## 2024-05-08 DIAGNOSIS — M8008XA Age-related osteoporosis with current pathological fracture, vertebra(e), initial encounter for fracture: Secondary | ICD-10-CM | POA: Diagnosis not present

## 2024-05-08 DIAGNOSIS — I10 Essential (primary) hypertension: Secondary | ICD-10-CM | POA: Diagnosis not present

## 2024-05-08 DIAGNOSIS — Z23 Encounter for immunization: Secondary | ICD-10-CM | POA: Diagnosis not present

## 2024-05-08 DIAGNOSIS — G4700 Insomnia, unspecified: Secondary | ICD-10-CM | POA: Diagnosis not present

## 2024-05-08 DIAGNOSIS — R739 Hyperglycemia, unspecified: Secondary | ICD-10-CM | POA: Diagnosis not present

## 2024-05-08 DIAGNOSIS — D72829 Elevated white blood cell count, unspecified: Secondary | ICD-10-CM | POA: Diagnosis not present

## 2024-05-08 DIAGNOSIS — D692 Other nonthrombocytopenic purpura: Secondary | ICD-10-CM | POA: Diagnosis not present

## 2024-05-08 DIAGNOSIS — E785 Hyperlipidemia, unspecified: Secondary | ICD-10-CM | POA: Diagnosis not present

## 2024-05-08 DIAGNOSIS — J449 Chronic obstructive pulmonary disease, unspecified: Secondary | ICD-10-CM | POA: Diagnosis not present
